# Patient Record
Sex: Male | Born: 1985
Health system: Southern US, Community
[De-identification: ages and names within clinical notes are randomized; demographics above are authoritative.]

## PROBLEM LIST (undated history)

## (undated) DIAGNOSIS — S81831A Puncture wound without foreign body, right lower leg, initial encounter: Secondary | ICD-10-CM

## (undated) DIAGNOSIS — G35 Multiple sclerosis: Secondary | ICD-10-CM

## (undated) DIAGNOSIS — K219 Gastro-esophageal reflux disease without esophagitis: Secondary | ICD-10-CM

## (undated) DIAGNOSIS — W3400XA Accidental discharge from unspecified firearms or gun, initial encounter: Secondary | ICD-10-CM

## (undated) HISTORY — PX: OTHER SURGICAL HISTORY: SHX169

## (undated) HISTORY — DX: Gastro-esophageal reflux disease without esophagitis: K21.9

## (undated) HISTORY — PX: APPENDECTOMY: SHX54

---
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
## 2003-01-12 ENCOUNTER — Emergency Department (HOSPITAL_COMMUNITY): Admission: EM | Admit: 2003-01-12 | Discharge: 2003-01-12 | Payer: Self-pay | Admitting: Emergency Medicine

## 2003-12-23 ENCOUNTER — Inpatient Hospital Stay (HOSPITAL_COMMUNITY): Admission: EM | Admit: 2003-12-23 | Discharge: 2003-12-24 | Payer: Self-pay | Admitting: Emergency Medicine

## 2003-12-24 ENCOUNTER — Encounter (INDEPENDENT_AMBULATORY_CARE_PROVIDER_SITE_OTHER): Payer: Self-pay | Admitting: *Deleted

## 2004-04-03 ENCOUNTER — Emergency Department (HOSPITAL_COMMUNITY): Admission: EM | Admit: 2004-04-03 | Discharge: 2004-04-03 | Payer: Self-pay | Admitting: Emergency Medicine

## 2004-06-08 ENCOUNTER — Emergency Department (HOSPITAL_COMMUNITY): Admission: EM | Admit: 2004-06-08 | Discharge: 2004-06-08 | Payer: Self-pay | Admitting: Family Medicine

## 2004-06-10 ENCOUNTER — Emergency Department (HOSPITAL_COMMUNITY): Admission: EM | Admit: 2004-06-10 | Discharge: 2004-06-10 | Payer: Self-pay | Admitting: *Deleted

## 2005-03-02 ENCOUNTER — Emergency Department (HOSPITAL_COMMUNITY): Admission: EM | Admit: 2005-03-02 | Discharge: 2005-03-02 | Payer: Self-pay | Admitting: Emergency Medicine

## 2005-12-26 ENCOUNTER — Emergency Department (HOSPITAL_COMMUNITY): Admission: EM | Admit: 2005-12-26 | Discharge: 2005-12-26 | Payer: Self-pay | Admitting: Emergency Medicine

## 2006-05-06 ENCOUNTER — Emergency Department (HOSPITAL_COMMUNITY): Admission: EM | Admit: 2006-05-06 | Discharge: 2006-05-06 | Payer: Self-pay | Admitting: Emergency Medicine

## 2006-05-19 ENCOUNTER — Emergency Department (HOSPITAL_COMMUNITY): Admission: EM | Admit: 2006-05-19 | Discharge: 2006-05-19 | Payer: Self-pay | Admitting: Family Medicine

## 2006-06-13 ENCOUNTER — Emergency Department (HOSPITAL_COMMUNITY): Admission: EM | Admit: 2006-06-13 | Discharge: 2006-06-13 | Payer: Self-pay | Admitting: Family Medicine

## 2008-10-28 ENCOUNTER — Emergency Department (HOSPITAL_COMMUNITY): Admission: EM | Admit: 2008-10-28 | Discharge: 2008-10-28 | Payer: Self-pay | Admitting: Emergency Medicine

## 2008-10-31 ENCOUNTER — Emergency Department (HOSPITAL_COMMUNITY): Admission: EM | Admit: 2008-10-31 | Discharge: 2008-10-31 | Payer: Self-pay | Admitting: Emergency Medicine

## 2008-11-03 ENCOUNTER — Emergency Department (HOSPITAL_COMMUNITY): Admission: EM | Admit: 2008-11-03 | Discharge: 2008-11-03 | Payer: Self-pay | Admitting: Emergency Medicine

## 2010-10-30 NOTE — Op Note (Signed)
NAME:  Shaun Bentley, Shaun Bentley                          ACCOUNT NO.:  [id number]   MEDICAL RECORD NO.:  [id number]                   PATIENT TYPE:  EMS   LOCATION:  ED                                   FACILITY:  [facility]   PHYSICIAN:  Anselm Pancoast. Zachery Dakins, M.D.          DATE OF BIRTH:  1985-07-23   DATE OF PROCEDURE:  12/23/2003  DATE OF DISCHARGE:                                 OPERATIVE REPORT   PREOPERATIVE DIAGNOSIS:  Possible early acute appendicitis.   POSTOPERATIVE DIAGNOSIS:  Await final pathology.   OPERATION:  Appendectomy.   SURGEON:  Anselm Pancoast. Zachery Dakins, M.D.   HISTORY:  Shaun Bentley is a 25 year old male who presented to the emergency  room this evening, was seen by Dr. Stacie Acres, who describes the patient having  right lower quadrant pain.  It appears that the patient morning started  having lower abdominal pain.  He has had a back problem, and he sort of  moved or wiggled or something or other and then was complaining of more  abdominal pain and when he was seen first in the emergency room, it was  described as very uncomfortable, and he was given lab studies.  His white  count was not elevated but then because of the pain, he had a CT and the CT  of the abdomen and pelvis is consistent with an early appendicitis.  I could  not see any obvious inflammation, but the appendix was about 9-10 mm and  when I saw him, he had received 4 mg of Dilaudid and was just basically  sleepy, but if you tried to wiggle on him, pushing the right lower quadrant,  he complained of pain.  His urinalysis was negative, and I did not see any  other abnormalities on the CT and thought it best just to proceed on with an  open appendectomy.  The patient is very thin, and I thought that an open  would be less traumatic than trying to do a laparoscopic.   The patient was given 3 g of Unasyn in the emergency room, permission  obtained, and he was taken to the operative suite and induction of general  anesthesia.  The abdomen was prepped with Betadine surgical solution after  clipping the right lower quadrant and then draped in a sterile manner.  Within the lateral edge of the rectus and kind of in the right lower  quadrant, a little oblique incision was made, sharp dissection then through  the skin, the Scarpa's fascia, and the external oblique was opened about an  inch.  The internal oblique and transversalis was stretched in the direction  of the fibers and then the peritoneum was picked up between hemostats and  carefully opened into the peritoneal cavity.  There was no exudate or any  vein in the right lower quadrant, and the appendix was visualized under the  cecum and grasped.  I could not see an obvious  acute appendicitis.  The  appendix is a little prominent in size but not engorged or basically stiff  like early acute appendicitis.  Then the appendiceal mesentery was divided  between hemostats, and these were ligated with 2-0 Vicryl.  The appendix was  freed right up to its base and then crushed with a hemostat and ligated with  2-0 Vicryl.  A pursestring suture of 3-0 silk was placed, the appendix  removed, the stump inverted, and the pursestring suture tied.  A second Z-  stitch was placed just to kind of reinforce the area.  The small bowel, the  terminal ileum looked normal and I ran the small bowel probably four or five  feet, did not see any evidence of a Meckel's, and there was no evidence of  any inflammation that I could see.  The small bowel was back in its normal  location and then I grasped the peritoneum with hemostats.  I just injected  the abdominal wall with 0.5% plain Marcaine for immediate postoperative pain  and then closed the peritoneum and internal oblique with a running 2-0  Vicryl.  The external oblique was closed with also a running 2-0 Vicryl,  Scarpa's  fascia closed with interrupted 4-0 Vicryl, and half-inch Steri-Strips and  Benzoin on the skin.   The patient tolerated the procedure nicely and was  taken to the recovery room in stable postop condition.  Await the results of  the final exam, and we will resume a liquid diet and progress as tolerated.                                               Anselm Pancoast. Zachery Dakins, M.D.    WJW/MEDQ  D:  12/24/2003  T:  12/24/2003  Job:  [id number]

## 2011-01-09 ENCOUNTER — Emergency Department (HOSPITAL_COMMUNITY)
Admission: EM | Admit: 2011-01-09 | Discharge: 2011-01-10 | Disposition: A | Discharge status: Home / Self Care | Payer: Self-pay | Attending: Emergency Medicine | Admitting: Emergency Medicine

## 2011-01-09 DIAGNOSIS — M25569 Pain in unspecified knee: Secondary | ICD-10-CM | POA: Insufficient documentation

## 2011-01-10 ENCOUNTER — Emergency Department (HOSPITAL_COMMUNITY)
Admission: EM | Admit: 2011-01-10 | Discharge: 2011-01-10 | Disposition: A | Discharge status: Home / Self Care | Payer: Self-pay | Attending: Emergency Medicine | Admitting: Emergency Medicine

## 2011-01-10 ENCOUNTER — Emergency Department (HOSPITAL_COMMUNITY): Payer: Self-pay

## 2011-01-10 DIAGNOSIS — M25569 Pain in unspecified knee: Secondary | ICD-10-CM | POA: Insufficient documentation

## 2011-07-06 ENCOUNTER — Encounter (HOSPITAL_COMMUNITY): Payer: Self-pay | Admitting: Emergency Medicine

## 2011-07-06 ENCOUNTER — Emergency Department (HOSPITAL_COMMUNITY)
Admission: EM | Admit: 2011-07-06 | Discharge: 2011-07-06 | Disposition: A | Discharge status: Home / Self Care | Payer: Self-pay | Attending: Emergency Medicine | Admitting: Emergency Medicine

## 2011-07-06 DIAGNOSIS — M6282 Rhabdomyolysis: Secondary | ICD-10-CM | POA: Insufficient documentation

## 2011-07-06 DIAGNOSIS — M79609 Pain in unspecified limb: Secondary | ICD-10-CM | POA: Insufficient documentation

## 2011-07-06 DIAGNOSIS — R252 Cramp and spasm: Secondary | ICD-10-CM | POA: Insufficient documentation

## 2011-07-06 DIAGNOSIS — Z87828 Personal history of other (healed) physical injury and trauma: Secondary | ICD-10-CM | POA: Insufficient documentation

## 2011-07-06 HISTORY — DX: Accidental discharge from unspecified firearms or gun, initial encounter: W34.00XA

## 2011-07-06 HISTORY — DX: Puncture wound without foreign body, right lower leg, initial encounter: S81.831A

## 2011-07-06 LAB — URINE MICROSCOPIC-ADD ON

## 2011-07-06 LAB — POCT I-STAT, CHEM 8
BUN: 26 mg/dL — ABNORMAL HIGH (ref 6–23)
Chloride: 105 mEq/L (ref 96–112)
Creatinine, Ser: 1.3 mg/dL (ref 0.50–1.35)
Potassium: 4 mEq/L (ref 3.5–5.1)
Sodium: 139 mEq/L (ref 135–145)

## 2011-07-06 LAB — URINALYSIS, ROUTINE W REFLEX MICROSCOPIC
Glucose, UA: NEGATIVE mg/dL
Specific Gravity, Urine: 1.014 (ref 1.005–1.030)

## 2011-07-06 MED ORDER — OXYCODONE-ACETAMINOPHEN 5-325 MG PO TABS
1.0000 | ORAL_TABLET | Freq: Once | ORAL | Status: AC
  Administered 2011-07-06: 1 via ORAL
  Filled 2011-07-06: qty 1

## 2011-07-06 MED ORDER — SODIUM CHLORIDE 0.9 % IV BOLUS (SEPSIS)
1000.0000 mL | Freq: Once | INTRAVENOUS | Status: AC
  Administered 2011-07-06: 1000 mL via INTRAVENOUS

## 2011-07-06 MED ORDER — CYCLOBENZAPRINE HCL 5 MG PO TABS: 5.0000 mg | ORAL_TABLET | Freq: Three times a day (TID) | ORAL | Status: AC | PRN

## 2011-07-06 MED ORDER — DIAZEPAM 5 MG PO TABS
10.0000 mg | ORAL_TABLET | Freq: Once | ORAL | Status: AC
  Administered 2011-07-06: 10 mg via ORAL
  Filled 2011-07-06: qty 2

## 2011-07-06 MED ORDER — OXYCODONE-ACETAMINOPHEN 5-325 MG PO TABS: 1.0000 | ORAL_TABLET | Freq: Four times a day (QID) | ORAL | Status: AC | PRN

## 2011-07-06 NOTE — ED Notes (Addendum)
C/o cramps in both lower extremities---also reports profuse sweating at night--saturating the sheets and mattress.  Works for [employer] and does a lot of heavy lifting during his 10 hour work day.

## 2011-07-06 NOTE — ED Notes (Signed)
Continues to sleep on stretcher and in no distress--Significant other at bedside---awaiting further orders.

## 2011-07-06 NOTE — ED Notes (Signed)
Sleeping on carrier--IV fluids infusing---awakens easily with verbal.

## 2011-07-06 NOTE — ED Notes (Signed)
Pt states he is having cramps in his legs and feet  Pt states it started earlier today and has progressively gotten worse   Pt states he has had decreased appetite lately

## 2011-07-06 NOTE — ED Notes (Signed)
Sleeping on carrier--Significant other at bedside.
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
## 2011-07-27 ENCOUNTER — Emergency Department (HOSPITAL_COMMUNITY)
Admission: EM | Admit: 2011-07-27 | Discharge: 2011-07-27 | Disposition: A | Discharge status: Home / Self Care | Payer: Self-pay | Attending: Emergency Medicine | Admitting: Emergency Medicine

## 2011-07-27 ENCOUNTER — Emergency Department (HOSPITAL_COMMUNITY): Payer: Self-pay

## 2011-07-27 DIAGNOSIS — R05 Cough: Secondary | ICD-10-CM | POA: Insufficient documentation

## 2011-07-27 DIAGNOSIS — R059 Cough, unspecified: Secondary | ICD-10-CM | POA: Insufficient documentation

## 2011-07-27 DIAGNOSIS — R509 Fever, unspecified: Secondary | ICD-10-CM | POA: Insufficient documentation

## 2011-07-27 DIAGNOSIS — R6889 Other general symptoms and signs: Secondary | ICD-10-CM | POA: Insufficient documentation

## 2011-07-27 DIAGNOSIS — R10816 Epigastric abdominal tenderness: Secondary | ICD-10-CM | POA: Insufficient documentation

## 2011-07-27 DIAGNOSIS — R52 Pain, unspecified: Secondary | ICD-10-CM | POA: Insufficient documentation

## 2011-07-27 DIAGNOSIS — R079 Chest pain, unspecified: Secondary | ICD-10-CM | POA: Insufficient documentation

## 2011-07-27 DIAGNOSIS — IMO0001 Reserved for inherently not codable concepts without codable children: Secondary | ICD-10-CM | POA: Insufficient documentation

## 2011-07-27 DIAGNOSIS — J111 Influenza due to unidentified influenza virus with other respiratory manifestations: Secondary | ICD-10-CM

## 2011-07-27 DIAGNOSIS — R070 Pain in throat: Secondary | ICD-10-CM | POA: Insufficient documentation

## 2011-07-27 LAB — POCT I-STAT, CHEM 8
Glucose, Bld: 98 mg/dL (ref 70–99)
HCT: 48 % (ref 39.0–52.0)
Hemoglobin: 16.3 g/dL (ref 13.0–17.0)
Potassium: 4 mEq/L (ref 3.5–5.1)
Sodium: 141 mEq/L (ref 135–145)

## 2011-07-27 MED ORDER — OSELTAMIVIR PHOSPHATE 75 MG PO CAPS: 75.0000 mg | ORAL_CAPSULE | Freq: Two times a day (BID) | ORAL | Status: AC

## 2011-07-27 MED ORDER — IBUPROFEN 600 MG PO TABS: 600.0000 mg | ORAL_TABLET | Freq: Four times a day (QID) | ORAL | Status: AC | PRN
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
## 2011-11-25 ENCOUNTER — Inpatient Hospital Stay (HOSPITAL_COMMUNITY)
Admission: EM | Admit: 2011-11-25 | Discharge: 2011-11-28 | DRG: 864 | Disposition: A | Discharge status: Home / Self Care | Payer: Self-pay | Attending: Family Medicine | Admitting: Family Medicine

## 2011-11-25 ENCOUNTER — Other Ambulatory Visit: Payer: Self-pay | Admitting: Oncology

## 2011-11-25 ENCOUNTER — Encounter (HOSPITAL_COMMUNITY): Payer: Self-pay | Admitting: *Deleted

## 2011-11-25 DIAGNOSIS — M311 Thrombotic microangiopathy, unspecified: Secondary | ICD-10-CM

## 2011-11-25 DIAGNOSIS — R1013 Epigastric pain: Secondary | ICD-10-CM | POA: Diagnosis present

## 2011-11-25 DIAGNOSIS — Z79899 Other long term (current) drug therapy: Secondary | ICD-10-CM

## 2011-11-25 DIAGNOSIS — E876 Hypokalemia: Secondary | ICD-10-CM | POA: Diagnosis present

## 2011-11-25 DIAGNOSIS — R110 Nausea: Secondary | ICD-10-CM | POA: Diagnosis present

## 2011-11-25 DIAGNOSIS — D693 Immune thrombocytopenic purpura: Secondary | ICD-10-CM

## 2011-11-25 DIAGNOSIS — B9789 Other viral agents as the cause of diseases classified elsewhere: Secondary | ICD-10-CM

## 2011-11-25 DIAGNOSIS — K5289 Other specified noninfective gastroenteritis and colitis: Secondary | ICD-10-CM | POA: Diagnosis present

## 2011-11-25 DIAGNOSIS — R5381 Other malaise: Secondary | ICD-10-CM | POA: Diagnosis present

## 2011-11-25 DIAGNOSIS — Z7982 Long term (current) use of aspirin: Secondary | ICD-10-CM

## 2011-11-25 DIAGNOSIS — R7309 Other abnormal glucose: Secondary | ICD-10-CM | POA: Diagnosis present

## 2011-11-25 DIAGNOSIS — D696 Thrombocytopenia, unspecified: Secondary | ICD-10-CM

## 2011-11-25 DIAGNOSIS — R509 Fever, unspecified: Principal | ICD-10-CM | POA: Diagnosis present

## 2011-11-25 DIAGNOSIS — G35 Multiple sclerosis: Secondary | ICD-10-CM | POA: Diagnosis present

## 2011-11-25 DIAGNOSIS — R5383 Other fatigue: Secondary | ICD-10-CM | POA: Diagnosis present

## 2011-11-25 HISTORY — DX: Multiple sclerosis: G35

## 2011-11-25 LAB — DIFFERENTIAL
Lymphs Abs: 1.1 10*3/uL (ref 0.7–4.0)
Monocytes Absolute: 0.8 10*3/uL (ref 0.1–1.0)
Monocytes Relative: 8 % (ref 3–12)
Neutro Abs: 7.9 10*3/uL — ABNORMAL HIGH (ref 1.7–7.7)
Neutrophils Relative %: 81 % — ABNORMAL HIGH (ref 43–77)

## 2011-11-25 LAB — COMPREHENSIVE METABOLIC PANEL
BUN: 15 mg/dL (ref 6–23)
Calcium: 9.5 mg/dL (ref 8.4–10.5)
GFR calc Af Amer: >90 mL/min (ref >90)
Glucose, Bld: 77 mg/dL (ref 70–99)
Total Protein: 7 g/dL (ref 6.0–8.3)

## 2011-11-25 LAB — OCCULT BLOOD, POC DEVICE: Fecal Occult Bld: NEGATIVE

## 2011-11-25 LAB — TYPE AND SCREEN: Antibody Screen: NEGATIVE

## 2011-11-25 LAB — LIPASE, BLOOD: Lipase: 28 U/L (ref 11–59)

## 2011-11-25 LAB — URINALYSIS, ROUTINE W REFLEX MICROSCOPIC
Bilirubin Urine: NEGATIVE
Nitrite: NEGATIVE
Specific Gravity, Urine: 1.035 — ABNORMAL HIGH (ref 1.005–1.030)
Urobilinogen, UA: 0.2 mg/dL (ref 0.0–1.0)

## 2011-11-25 LAB — CBC
HCT: 42.7 % (ref 39.0–52.0)
Hemoglobin: 15.1 g/dL (ref 13.0–17.0)
RBC: 4.81 MIL/uL (ref 4.22–5.81)

## 2011-11-25 LAB — LACTATE DEHYDROGENASE: LDH: 174 U/L (ref 94–250)

## 2011-11-25 LAB — URINE MICROSCOPIC-ADD ON

## 2011-11-25 MED ORDER — GI COCKTAIL ~~LOC~~
30.0000 mL | Freq: Once | ORAL | Status: AC
  Administered 2011-11-25: 30 mL via ORAL
  Filled 2011-11-25: qty 30

## 2011-11-25 MED ORDER — FAMOTIDINE IN NACL 20-0.9 MG/50ML-% IV SOLN
20.0000 mg | Freq: Once | INTRAVENOUS | Status: AC
  Administered 2011-11-25: 20 mg via INTRAVENOUS
  Filled 2011-11-25: qty 50

## 2011-11-25 MED ORDER — OMEPRAZOLE 20 MG PO CPDR: 20.0000 mg | DELAYED_RELEASE_CAPSULE | Freq: Every day | ORAL | Status: DC

## 2011-11-25 MED ORDER — SODIUM CHLORIDE 0.9 % IJ SOLN
3.0000 mL | Freq: Two times a day (BID) | INTRAMUSCULAR | Status: DC
  Administered 2011-11-25 – 2011-11-27 (×5): 3 mL via INTRAVENOUS

## 2011-11-25 MED ORDER — FAMOTIDINE 20 MG PO TABS: 20.0000 mg | ORAL_TABLET | Freq: Two times a day (BID) | ORAL | Status: DC

## 2011-11-25 MED ORDER — IMMUNE GLOBULIN (HUMAN) 10 GM/200ML IV SOLN
1.0000 g/kg | Freq: Once | INTRAVENOUS | Status: DC
  Filled 2011-11-25: qty 1600

## 2011-11-25 MED ORDER — PANTOPRAZOLE SODIUM 40 MG IV SOLR
40.0000 mg | Freq: Once | INTRAVENOUS | Status: AC
  Administered 2011-11-25: 40 mg via INTRAVENOUS
  Filled 2011-11-25: qty 40

## 2011-11-25 MED ORDER — ONDANSETRON 8 MG PO TBDP
8.0000 mg | ORAL_TABLET | Freq: Once | ORAL | Status: AC
  Administered 2011-11-25: 8 mg via ORAL
  Filled 2011-11-25: qty 1

## 2011-11-25 MED ORDER — DOXYCYCLINE HYCLATE 100 MG PO TABS
100.0000 mg | ORAL_TABLET | Freq: Two times a day (BID) | ORAL | Status: DC
  Administered 2011-11-25 – 2011-11-26 (×2): 100 mg via ORAL
  Filled 2011-11-25 (×3): qty 1

## 2011-11-25 MED ORDER — ONDANSETRON HCL 4 MG/2ML IJ SOLN: 4.0000 mg | Freq: Three times a day (TID) | INTRAMUSCULAR | Status: AC | PRN

## 2011-11-25 MED ORDER — SODIUM CHLORIDE 0.9 % IV SOLN: INTRAVENOUS | Status: AC

## 2011-11-25 MED ORDER — OXYCODONE-ACETAMINOPHEN 5-325 MG PO TABS
2.0000 | ORAL_TABLET | Freq: Once | ORAL | Status: AC
  Administered 2011-11-25: 2 via ORAL
  Filled 2011-11-25: qty 2

## 2011-11-25 MED ORDER — POTASSIUM CHLORIDE CRYS ER 20 MEQ PO TBCR
20.0000 meq | EXTENDED_RELEASE_TABLET | Freq: Once | ORAL | Status: AC
  Administered 2011-11-25: 20 meq via ORAL
  Filled 2011-11-25: qty 1

## 2011-11-25 MED ORDER — OXYCODONE-ACETAMINOPHEN 5-325 MG PO TABS
1.0000 | ORAL_TABLET | ORAL | Status: DC | PRN
  Administered 2011-11-25 – 2011-11-26 (×6): 1 via ORAL
  Filled 2011-11-25 (×6): qty 1

## 2011-11-25 MED ORDER — FAMOTIDINE IN NACL 20-0.9 MG/50ML-% IV SOLN: 20.0000 mg | Freq: Once | INTRAVENOUS | Status: DC

## 2011-11-25 MED ORDER — PREDNISONE 20 MG PO TABS
80.0000 mg | ORAL_TABLET | Freq: Once | ORAL | Status: AC
  Administered 2011-11-25: 80 mg via ORAL
  Filled 2011-11-25: qty 4
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
## 2011-11-25 NOTE — ED Notes (Signed)
5 east called for report 1x. RN will call back
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
## 2011-11-25 NOTE — ED Notes (Signed)
Pt. Given urine cup

## 2011-11-25 NOTE — ED Provider Notes (Signed)
Medical screening examination/treatment/procedure(s) were conducted as a shared visit with non-physician practitioner(s) and myself.  I personally evaluated the patient during the encounter  Epigastric abdominal pain which is been colicky. He has nausea but no vomiting. Intermittent episodes of diarrhea without blood. Rectal examination is negative. Found to be profoundly from cytopenic with a platelet count less than 5. Will be admitted to the hospital. Additional fluid studies were obtained.  Steroids administered  Dayton Bailiff, MD 11/25/11 1620

## 2011-11-25 NOTE — ED Notes (Signed)
Girlfriend states "he's been sick since yesterday, has severe abdominal pain, diarrhea, it even hurts to touch his skin, has just taken pepto bismol"

## 2011-11-25 NOTE — ED Notes (Signed)
Attempted to obtain labs.patient is on phone requesting to obtain labs later.RN Silvio Pate made aware

## 2011-11-25 NOTE — ED Notes (Signed)
Pt. Is on phone. Requesting for the phlebotomy to come at a later time.

## 2011-11-26 DIAGNOSIS — B9789 Other viral agents as the cause of diseases classified elsewhere: Secondary | ICD-10-CM

## 2011-11-26 DIAGNOSIS — M311 Thrombotic microangiopathy, unspecified: Secondary | ICD-10-CM

## 2011-11-26 LAB — COMPREHENSIVE METABOLIC PANEL
ALT: 15 U/L (ref 0–53)
Alkaline Phosphatase: 61 U/L (ref 39–117)
CO2: 25 mEq/L (ref 19–32)
Chloride: 101 mEq/L (ref 96–112)
GFR calc Af Amer: 84 mL/min — ABNORMAL LOW (ref >90)
GFR calc non Af Amer: 73 mL/min — ABNORMAL LOW (ref >90)
Glucose, Bld: 136 mg/dL — ABNORMAL HIGH (ref 70–99)
Potassium: 4.5 mEq/L (ref 3.5–5.1)
Sodium: 135 mEq/L (ref 135–145)
Total Bilirubin: 0.5 mg/dL (ref 0.3–1.2)

## 2011-11-26 LAB — DIFFERENTIAL
Basophils Relative: 0 % (ref 0–1)
Eosinophils Absolute: 0 10*3/uL (ref 0.0–0.7)
Neutrophils Relative %: 94 % — ABNORMAL HIGH (ref 43–77)

## 2011-11-26 LAB — CBC
MCH: 31.5 pg (ref 26.0–34.0)
MCHC: 35.7 g/dL (ref 30.0–36.0)
Platelets: 193 10*3/uL (ref 150–400)

## 2011-11-26 LAB — EHRLICHIA ANTIBODY PANEL
E chaffeensis (HGE) Ab, IgG: NEGATIVE
E chaffeensis (HGE) Ab, IgM: NEGATIVE

## 2011-11-26 LAB — LIPASE, BLOOD: Lipase: 22 U/L (ref 11–59)

## 2011-11-26 MED ORDER — FAMOTIDINE 40 MG PO TABS
40.0000 mg | ORAL_TABLET | Freq: Two times a day (BID) | ORAL | Status: DC
  Administered 2011-11-26 – 2011-11-28 (×4): 40 mg via ORAL
  Filled 2011-11-26 (×5): qty 1

## 2011-11-26 MED ORDER — LORAZEPAM 0.5 MG PO TABS
0.5000 mg | ORAL_TABLET | Freq: Two times a day (BID) | ORAL | Status: DC
  Administered 2011-11-26 – 2011-11-27 (×3): 0.5 mg via ORAL
  Filled 2011-11-26 (×4): qty 1

## 2011-11-26 MED ORDER — HYDROMORPHONE HCL PF 1 MG/ML IJ SOLN
1.0000 mg | INTRAMUSCULAR | Status: DC | PRN
  Administered 2011-11-26 – 2011-11-27 (×5): 1 mg via INTRAVENOUS
  Filled 2011-11-26 (×5): qty 1

## 2011-11-26 NOTE — Progress Notes (Signed)
PROGRESS NOTE  KYZER BLOWE ZOX:[id number] DOB: 12-16-1985 DOA: 11/25/2011 PCP: No primary provider on file.  Brief narrative: 26 yr old admitted with severe TCP (later found to be lab error), N/Diarrhoea  Past medical history: Appendectomy and surgeries  Consultants:  none  Procedures:  Ehlichia/RMSF/HIV neg  Antibiotics:  Doxycycline 100 bid 6/13-->6/14   Subjective  Has abdominal pain, central abdomen.  No assosc n/v  Stool was dark today, but no noted blood.   Denies NSAID abuse-states Abdmonal pain is worse when he eats.  No classical pancreatic signs  like boring pain to the back No rash, cough,fever    Objective    Interim History: Nursing reports increased pain needs-requesting Opiates on the 4 hour mark  Subjective:   Objective: Filed Vitals:   11/25/11 2000 11/25/11 2225 11/26/11 0510 11/26/11 1450  BP:  102/66 129/79 122/59  Pulse:  61 59 74  Temp:  98.3 F (36.8 C) 97.8 F (36.6 C) 98.1 F (36.7 C)  TempSrc:  Oral Oral Oral  Resp:  18 18 18   Height: 5\' 11"  (1.803 m)     Weight: 78.926 kg (174 lb)     SpO2:  98% 99% 100%   No intake or output data in the 24 hours ending 11/26/11 1827  Exam:   General:  Alert pleasant African American male mentating well. No apparent distress.   Eyes: No scleral icterus, no pallor   ENT: Neck soft supple no palpable lymphadenopathy in cervical or submandibular lymph nodes-throat is clear without rash and has good dentition   Neck: See above   Cardiovascular: S1-S2 no murmur rub or gallop   Respiratory: Clinically clear   Abdomen: Soft nontender nondistended no palpable hepatosplenomegaly-no rebound or gaurding.  No epig tenderness  Skin: No rash- multiple tattoos   Musculoskeletal: No joint dysfunction or limited range of motion to all 4 extremities   Psychiatric: Euthymic   Neurologic: Grossly normal  Data Reviewed: Basic Metabolic Panel:  Lab 11/26/11 8119 11/25/11 1300  NA 135 137  K  4.5 3.4*  CL 101 101  CO2 25 24  GLUCOSE 136* 77  BUN 16 15  CREATININE 1.31 1.17  CALCIUM 9.6 9.5  MG -- --  PHOS -- --   Liver Function Tests:  Lab 11/26/11 0514 11/25/11 1300  AST 15 16  ALT 15 15  ALKPHOS 61 64  BILITOT 0.5 0.7  PROT 7.1 7.0  ALBUMIN 3.9 4.2    Lab 11/25/11 1300  LIPASE 28  AMYLASE --   No results found for this basename: AMMONIA:5 in the last 168 hours CBC:  Lab 11/26/11 0500 11/25/11 1741  WBC 13.8* 9.8  NEUTROABS 12.9* 7.9*  HGB 15.4 15.1  HCT 43.1 42.7  MCV 88.1 88.8  PLT 193 185   Cardiac Enzymes: No results found for this basename: CKTOTAL:5,CKMB:5,CKMBINDEX:5,TROPONINI:5 in the last 168 hours BNP: No components found with this basename: POCBNP:5 CBG: No results found for this basename: GLUCAP:5 in the last 168 hours  No results found for this or any previous visit (from the past 240 hour(s)).   Studies:              All Imaging reviewed and is as per above notation   Scheduled Meds:   . sodium chloride   Intravenous STAT  . sodium chloride  3 mL Intravenous Q12H  . DISCONTD: doxycycline  100 mg Oral Q12H  . DISCONTD: Immune Globulin 5%  1 g/kg Intravenous Once   Continuous Infusions:  Assessment/Plan: 1. Febrile illness-given vague abd pain, malaise with "skin hurting"differentials include Viral GE vs occult bacterial illness (possible Gastric pathogen)  However diarrhea seems to have completely stopped.  Will order stool studies, check Hepatitis panel (although unlikely given normal LFT's) and for now d/c doxycycline and keep him on clear liquids since he isn't able to take much PO anyway.  I will add some Pepcid to medication.  If he spikes a temperature will get Blood and Urine culture stat as well as CXR and will order broad spectrum coverage with Vancomycin and Zosyn-rule out Pancreatitis (unlikely) with Lipase 2. Abd pain-Possibly related to Ulcer?  Add PPI-  If hemoccult +, would consult GI for recs, as he stated he had  "dark stool"  Code Status: Full  Family Communication: none at bedside  Disposition Plan: inpatient   Pleas Koch, MD  [facility] Pager [phone] 11/26/2011, 6:27 PM    LOS: 1 day

## 2011-11-26 NOTE — Progress Notes (Signed)
Late entry for 11/25/11 1215 Pt listed as self pay with no insurance coverage Pt confirms he is self pay [place] resident CM and [facility] community liaison spoke with him Pt offered [facility] services to assist with finding a [place] self pay provider Pt accepted information

## 2011-11-26 NOTE — Progress Notes (Signed)
Per extern patient was arguing with some visitors.  Checked on patient noted to be crying and upset.  Asked for something for anxiety.  Paged MD and order given for ativan 0.5mg  PO BID

## 2011-11-26 NOTE — Progress Notes (Signed)
CARE MANAGEMENT NOTE 11/26/2011  Patient:  Rabbani,Plez A   Account Number:  [id number]  Date Initiated:  11/26/2011  Documentation initiated by:  Lissie Hinesley  Subjective/Objective Assessment:   pt with platlets count of less than 5.  c/o weakness nausea and vomiting     Action/Plan:   lives at home   Anticipated DC Date:  11/29/2011   Anticipated DC Plan:  HOME/SELF CARE  In-house referral  NA      DC Planning Services  NA      [facility] Choice  NA   Choice offered to / List presented to:  NA   DME arranged  NA      DME agency  NA     HH arranged  NA      HH agency  NA   Status of service:  In process, will continue to follow Medicare Important Message given?  NA - LOS <3 / Initial given by admissions (If response is "NO", the following Medicare IM given date fields will be blank) Date Medicare IM given:   Date Additional Medicare IM given:    Discharge Disposition:    Per UR Regulation:  Reviewed for med. necessity/level of care/duration of stay  If discussed at Long Length of Stay Meetings, dates discussed:    Comments:  [id number]/Bahja Bence,RN,BSN,CCM

## 2011-11-27 DIAGNOSIS — B9789 Other viral agents as the cause of diseases classified elsewhere: Secondary | ICD-10-CM

## 2011-11-27 DIAGNOSIS — M311 Thrombotic microangiopathy, unspecified: Secondary | ICD-10-CM

## 2011-11-27 LAB — CBC
MCV: 87.2 fL (ref 78.0–100.0)
Platelets: 207 10*3/uL (ref 150–400)
RDW: 11.8 % (ref 11.5–15.5)
WBC: 8.1 10*3/uL (ref 4.0–10.5)

## 2011-11-27 LAB — BASIC METABOLIC PANEL
Chloride: 101 mEq/L (ref 96–112)
Creatinine, Ser: 1.08 mg/dL (ref 0.50–1.35)
GFR calc Af Amer: >90 mL/min (ref >90)
GFR calc non Af Amer: >90 mL/min (ref >90)
Potassium: 3.3 mEq/L — ABNORMAL LOW (ref 3.5–5.1)

## 2011-11-27 MED ORDER — TRAMADOL HCL 50 MG PO TABS
50.0000 mg | ORAL_TABLET | Freq: Four times a day (QID) | ORAL | Status: DC
  Administered 2011-11-27 – 2011-11-28 (×4): 50 mg via ORAL
  Filled 2011-11-27 (×8): qty 1

## 2011-11-27 MED ORDER — ACETAMINOPHEN 325 MG PO TABS: 650.0000 mg | ORAL_TABLET | Freq: Four times a day (QID) | ORAL | Status: DC | PRN

## 2011-11-27 NOTE — Progress Notes (Signed)
PROGRESS NOTE  DIONTAE ROUTE QMV:[id number] DOB: [birth year] DOA: 11/25/2011 PCP: No primary provider on file.  Brief narrative: 26 yr old admitted with severe TCP (later found to be lab error), N/Diarrhoea, malaise  Past medical history: Appendectomy and surgeries  Consultants:  none  Procedures:  Ehlichia/RMSF/HIV neg  Antibiotics:  Doxycycline 100 bid 6/13-->6/14   Subjective  Patient somewhat sedated. Taking by mouth fluids only. Wishes to eat.  No further abdominal pain, but has been getting that q3 No rash, cough,fever    Objective    Interim History: Nursing reports increased somnolence Subjective:   Objective: Filed Vitals:   11/26/11 0510 11/26/11 1450 11/26/11 2226 11/27/11 0610  BP: 129/79 122/59 112/76 109/59  Pulse: 59 74 56 70  Temp: 97.8 F (36.6 C) 98.1 F (36.7 C) 97.9 F (36.6 C) 98.1 F (36.7 C)  TempSrc: Oral Oral Oral Oral  Resp: 18 18 19 20   Height:      Weight:      SpO2: 99% 100% 98% 97%    Intake/Output Summary (Last 24 hours) at 11/27/11 1437 Last data filed at 11/26/11 2239  Gross per 24 hour  Intake      3 ml  Output      0 ml  Net      3 ml    Exam:   General:  Alert pleasant African American male mentating well. No apparent distress.   Eyes: No scleral icterus, no pallor   ENT: Neck soft supple no palpable lymphadenopathy in cervical or submandibular lymph nodes-throat is clear without rash and has good dentition   Neck: See above   Cardiovascular: S1-S2 no murmur rub or gallop   Respiratory: Clinically clear   Abdomen: Soft nontender nondistended no palpable hepatosplenomegaly-no rebound or gaurding.  No epig tenderness  Skin: No rash- multiple tattoos   Musculoskeletal: No joint dysfunction or limited range of motion to all 4 extremities   Psychiatric: Euthymic   Neurologic: Grossly normal  Data Reviewed: Basic Metabolic Panel:  Lab 11/27/11 2841 11/26/11 0514 11/25/11 1300  NA 136 135 137  K 3.3*  4.5 --  CL 101 101 101  CO2 25 25 24   GLUCOSE 111* 136* 77  BUN 18 16 15   CREATININE 1.08 1.31 1.17  CALCIUM 9.2 9.6 9.5  MG -- -- --  PHOS -- -- --   Liver Function Tests:  Lab 11/26/11 0514 11/25/11 1300  AST 15 16  ALT 15 15  ALKPHOS 61 64  BILITOT 0.5 0.7  PROT 7.1 7.0  ALBUMIN 3.9 4.2    Lab 11/26/11 0514 11/25/11 1300  LIPASE 22 28  AMYLASE -- --   No results found for this basename: AMMONIA:5 in the last 168 hours CBC:  Lab 11/27/11 0538 11/26/11 0500 11/25/11 1741  WBC 8.1 13.8* 9.8  NEUTROABS -- 12.9* 7.9*  HGB 15.2 15.4 15.1  HCT 42.3 43.1 42.7  MCV 87.2 88.1 88.8  PLT 207 193 185   Cardiac Enzymes: No results found for this basename: CKTOTAL:5,CKMB:5,CKMBINDEX:5,TROPONINI:5 in the last 168 hours BNP: No components found with this basename: POCBNP:5 CBG: No results found for this basename: GLUCAP:5 in the last 168 hours  No results found for this or any previous visit (from the past 240 hour(s)).   Studies:              All Imaging reviewed and is as per above notation   Scheduled Meds:    . sodium chloride   Intravenous STAT  .  famotidine  40 mg Oral BID  . LORazepam  0.5 mg Oral BID  . sodium chloride  3 mL Intravenous Q12H  . DISCONTD: doxycycline  100 mg Oral Q12H   Continuous Infusions:    Assessment/Plan: 1. Febrile illness-given vague abd pain, malaise with "skin hurting"differentials include Viral GE vs occult bacterial illness (possible Gastric pathogen)  However diarrhea seems to have completely stopped.  Will order stool studies, check Hepatitis panel -still pending (although unlikely given normal LFT's) and for now d/c doxycycline.  Lipase 22, ruling out pancreatitis-white count is down from 13-8 I will add some Pepcid to medication. 2.   Abd pain-Possibly related to Ulcer?  Add PPI-  patient has passed no stool, and this is likely secondary opiates.  Will DC the same 3. Elevated blood sugar without a diagnosis of diabetes-get A1c  in the morning 4. Hypokalemia-will replace if drops below 3.0.  BMET in the morning  Code Status: Full  Family Communication: none at bedside  Disposition Plan: inpatient-could potentially discharge in the morning   Pleas Koch, MD  [facility] Pager [phone] 11/27/2011, 2:37 PM    LOS: 2 days

## 2011-11-28 DIAGNOSIS — B9789 Other viral agents as the cause of diseases classified elsewhere: Secondary | ICD-10-CM

## 2011-11-28 DIAGNOSIS — M311 Thrombotic microangiopathy, unspecified: Secondary | ICD-10-CM

## 2011-11-28 LAB — CBC
HCT: 42.4 % (ref 39.0–52.0)
MCH: 31.2 pg (ref 26.0–34.0)
MCV: 87.6 fL (ref 78.0–100.0)
Platelets: 220 10*3/uL (ref 150–400)
RBC: 4.84 MIL/uL (ref 4.22–5.81)

## 2011-11-28 LAB — BASIC METABOLIC PANEL
BUN: 16 mg/dL (ref 6–23)
CO2: 30 mEq/L (ref 19–32)
Calcium: 9.6 mg/dL (ref 8.4–10.5)
Creatinine, Ser: 1.26 mg/dL (ref 0.50–1.35)
Glucose, Bld: 97 mg/dL (ref 70–99)

## 2011-11-28 MED ORDER — TRAMADOL HCL 50 MG PO TABS: 50.0000 mg | ORAL_TABLET | Freq: Four times a day (QID) | ORAL | Status: AC

## 2011-11-28 MED ORDER — PANTOPRAZOLE SODIUM 40 MG PO TBEC
40.0000 mg | DELAYED_RELEASE_TABLET | Freq: Two times a day (BID) | ORAL | Status: DC
Start: 2011-11-28 — End: 2011-11-28

## 2011-11-28 MED ORDER — OMEPRAZOLE 40 MG PO CPDR: 40.0000 mg | DELAYED_RELEASE_CAPSULE | Freq: Every day | ORAL | Status: DC

## 2011-11-28 MED ORDER — ACETAMINOPHEN 325 MG PO TABS: 650.0000 mg | ORAL_TABLET | Freq: Four times a day (QID) | ORAL | Status: AC | PRN

## 2011-11-28 MED ORDER — FAMOTIDINE 40 MG PO TABS: 40.0000 mg | ORAL_TABLET | Freq: Two times a day (BID) | ORAL | Status: DC

## 2011-11-28 NOTE — Care Management (Signed)
Cm spoke with pt concerning PCP referral. Pt provided with information concerning [facility] referral service & [facility]. Pt instructed to contact both agencies Monday 11/29/11 concerning scheduling a PCP follow up appt. No other needs stated.   Leonie Green [phone]

## 2011-11-28 NOTE — Progress Notes (Signed)
Patient tolerated advancement of diet to regular yesterday evening. He was requesting iv pain medication although he was asleep during rounds and when it was time for scheduled ultram. Plan of care and progression was discussed with the patient in reference to pain management. Pt admitted in front of day and night nurse that he enjoyed "the high" from iv dilaudid which has been discontinued

## 2011-11-28 NOTE — Discharge Summary (Signed)
[facility] Hospital Discharge Summary  Things to Follow-up on: Needs review by a PCP-might need coordination for PUD A1c was 5.4-needs coordiation with CM for PCP Follow Hepatitis panel as out-patient       Date of Admission: 11/25/2011 11:44 AM Admitter: @ADMITPROV @   Date of Discharge6/16/2013 Attending Physician: Rhetta Mura, MD  Shaun Bentley ZOX:[id number] DOB: 22-Oct-1985 DOA: 11/25/2011  PCP: No primary provider on file.   Brief narrative:  26 yr old admitted with severe TCP (later found to be lab error), N/Diarrhoea, malaise   Past medical history:  Appendectomy and surgeries   Consultants:  None  Procedures:  Ehlichia/RMSF/HIV neg  Antibiotics:  Doxycycline 100 bid 6/13-->6/14   Hospital Course by problem list: 1. Febrile illness-given vague abd pain, malaise with "skin hurting"differentials include Viral GE vs occult bacterial illness (possible Gastric pathogen)  However diarrhea seems to have completely stopped.  Will order stool studies (passed no stool), check Hepatitis panel -still pending (although unlikely given normal LFT's) and for now d/c doxycycline.  Lipase 22, ruling out pancreatitis-white count is down from 13--8-->5 I will add some Pepcid to medication + Omeprazole.  He should follow as an out-patient for further care 2.  Abd pain-Possibly related to Ulcer?  Add PPI- to the H2 blocker on d/c patient has passed no stool, and this is likely secondary opiates.  Will DC the same  3. Elevated blood sugar without a diagnosis of diabetes-A1c was 5.4.  Have explained needs diabetic counseling and to manage diet appropriately 4. Hypokalemia-replaced and stable   LOS: 3 days    Procedures Performed and pertinent labs: No results found.   Discharge Vitals & PE:  BP 99/63  Pulse 54  Temp 98 F (36.7 C) (Oral)  Resp 18  Ht 5\' 11"  (1.803 m)  Wt 78.926 kg (174 lb)  BMI 24.27 kg/m2  SpO2 96%General:  Alert pleasant African American male  mentating well. No apparent distress.   Eyes: No scleral icterus, no pallor   ENT: Neck soft supple no palpable lymphadenopathy in cervical or submandibular lymph nodes-throat is clear without rash and has good dentition   Neck: See above   Cardiovascular: S1-S2 no murmur rub or gallop   Respiratory: Clinically clear   Abdomen: Soft nontender nondistended no palpable hepatosplenomegaly-no rebound or gaurding.  No epig tenderness   Skin: No rash- multiple tattoos   Musculoskeletal: No joint dysfunction or limited range of motion to all 4 extremities   Psychiatric: Euthymic   Neurologic: Grossly normal    Discharge Labs:  Results for orders placed during the hospital encounter of 11/25/11 (from the past 24 hour(s))  CBC     Status: Normal   Collection Time   11/28/11  5:45 AM      Component Value Range   WBC 5.7  4.0 - 10.5 K/uL   RBC 4.84  4.22 - 5.81 MIL/uL   Hemoglobin 15.1  13.0 - 17.0 g/dL   HCT 81.1  91.4 - 78.2 %   MCV 87.6  78.0 - 100.0 fL   MCH 31.2  26.0 - 34.0 pg   MCHC 35.6  30.0 - 36.0 g/dL   RDW 95.6  21.3 - 08.6 %   Platelets 220  150 - 400 K/uL  BASIC METABOLIC PANEL     Status: Abnormal   Collection Time   11/28/11  5:45 AM      Component Value Range   Sodium 139  135 - 145 mEq/L   Potassium 3.5  3.5 - 5.1 mEq/L   Chloride 101  96 - 112 mEq/L   CO2 30  19 - 32 mEq/L   Glucose, Bld 97  70 - 99 mg/dL   BUN 16  6 - 23 mg/dL   Creatinine, Ser 7.82  0.50 - 1.35 mg/dL   Calcium 9.6  8.4 - 95.6 mg/dL   GFR calc non Af Amer 76 (*) >90 mL/min   GFR calc Af Amer 89 (*) >90 mL/min    Disposition and follow-up:   [name] was discharged from in good condition.    Follow-up Appointments: Will need to establish with PCP   Discharge Medications: Medication List  As of 11/28/2011  9:47 AM   STOP taking these medications         aspirin 325 MG tablet      OVER THE COUNTER MEDICATION         TAKE these medications         acetaminophen 325  MG tablet   Commonly known as: TYLENOL   Take 2 tablets (650 mg total) by mouth every 6 (six) hours as needed.      famotidine 40 MG tablet   Commonly known as: PEPCID   Take 1 tablet (40 mg total) by mouth 2 (two) times daily.      omeprazole 40 MG capsule   Commonly known as: PRILOSEC   Take 1 capsule (40 mg total) by mouth daily.      traMADol 50 MG tablet   Commonly known as: ULTRAM   Take 1 tablet (50 mg total) by mouth every 6 (six) hours.           Medications Discontinued During This Encounter  Medication Reason  . omeprazole (PRILOSEC) 20 MG capsule   . famotidine (PEPCID) 20 MG tablet   . famotidine (PEPCID) 20-0.9 MG/50ML-%   . Immune Globulin 5% (OCTAGAM) SOLN 80 g   . doxycycline (VIBRA-TABS) tablet 100 mg   . oxyCODONE-acetaminophen (PERCOCET) 5-325 MG per tablet 1 tablet   . HYDROmorphone (DILAUDID) injection 1 mg   . OVER THE COUNTER MEDICATION Stop Taking at Discharge  . aspirin 325 MG tablet Stop Taking at Discharge  . LORazepam (ATIVAN) tablet 0.5 mg   . sodium chloride 0.9 % injection 3 mL     > 40 minutes time spent preparing d/c summary, including direct face-face patient Time, contact with consultants, family and care coordination   Signed: Rhetta Mura 11/28/2011, 9:47 AM

## 2011-11-28 NOTE — Progress Notes (Signed)
Discharge instructions reviewed with patient no questions at this time  

## 2011-11-29 LAB — HEPATITIS PANEL, ACUTE
Hep B C IgM: NEGATIVE
Hepatitis B Surface Ag: NEGATIVE

## 2013-04-19 ENCOUNTER — Encounter (HOSPITAL_COMMUNITY): Payer: Self-pay | Admitting: Emergency Medicine

## 2013-04-19 ENCOUNTER — Emergency Department (HOSPITAL_COMMUNITY)
Admission: EM | Admit: 2013-04-19 | Discharge: 2013-04-19 | Disposition: A | Discharge status: Home / Self Care | Payer: Self-pay | Attending: Emergency Medicine | Admitting: Emergency Medicine

## 2013-04-19 DIAGNOSIS — M79609 Pain in unspecified limb: Secondary | ICD-10-CM | POA: Insufficient documentation

## 2013-04-19 DIAGNOSIS — Z87828 Personal history of other (healed) physical injury and trauma: Secondary | ICD-10-CM | POA: Insufficient documentation

## 2013-04-19 DIAGNOSIS — R519 Headache, unspecified: Secondary | ICD-10-CM | POA: Diagnosis present

## 2013-04-19 DIAGNOSIS — R252 Cramp and spasm: Secondary | ICD-10-CM | POA: Diagnosis present

## 2013-04-19 DIAGNOSIS — R000 Tachycardia, unspecified: Secondary | ICD-10-CM | POA: Insufficient documentation

## 2013-04-19 DIAGNOSIS — G8929 Other chronic pain: Secondary | ICD-10-CM | POA: Insufficient documentation

## 2013-04-19 DIAGNOSIS — Z8669 Personal history of other diseases of the nervous system and sense organs: Secondary | ICD-10-CM | POA: Insufficient documentation

## 2013-04-19 DIAGNOSIS — R51 Headache: Secondary | ICD-10-CM | POA: Insufficient documentation

## 2013-04-19 DIAGNOSIS — R209 Unspecified disturbances of skin sensation: Secondary | ICD-10-CM | POA: Insufficient documentation

## 2013-04-19 LAB — BASIC METABOLIC PANEL
BUN: 22 mg/dL (ref 6–23)
CO2: 26 mEq/L (ref 19–32)
Chloride: 103 mEq/L (ref 96–112)
Creatinine, Ser: 1.24 mg/dL (ref 0.50–1.35)
Glucose, Bld: 115 mg/dL — ABNORMAL HIGH (ref 70–99)

## 2013-04-19 LAB — CBC
HCT: 42.3 % (ref 39.0–52.0)
Hemoglobin: 15.2 g/dL (ref 13.0–17.0)
MCH: 32 pg (ref 26.0–34.0)
MCHC: 35.9 g/dL (ref 30.0–36.0)
MCV: 89.1 fL (ref 78.0–100.0)
RDW: 12.3 % (ref 11.5–15.5)

## 2013-04-19 MED ORDER — SODIUM CHLORIDE 0.9 % IV BOLUS (SEPSIS)
1000.0000 mL | INTRAVENOUS | Status: AC
  Administered 2013-04-19: 1000 mL via INTRAVENOUS

## 2013-04-19 MED ORDER — KETOROLAC TROMETHAMINE 30 MG/ML IJ SOLN
30.0000 mg | Freq: Once | INTRAMUSCULAR | Status: AC
  Administered 2013-04-19: 30 mg via INTRAVENOUS
  Filled 2013-04-19: qty 1

## 2013-04-19 MED ORDER — HYDROCOD POLST-CHLORPHEN POLST 10-8 MG/5ML PO LQCR: 5.0000 mL | Freq: Two times a day (BID) | ORAL | Status: DC | PRN

## 2013-04-19 MED ORDER — OXYCODONE-ACETAMINOPHEN 5-325 MG PO TABS: 1.0000 | ORAL_TABLET | Freq: Four times a day (QID) | ORAL | Status: DC | PRN

## 2013-04-19 NOTE — Progress Notes (Signed)
[facility] provided pt with a list of primary care resources.  °
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
## 2013-04-19 NOTE — ED Notes (Signed)
Pt states that two years ago went to doctor was told has MS pt symptoms werent that bad so he quit going. Pt states that he has bilat hand pain, bilat leg pain, headache, eye pain and other generalized body aches that have been going on but he has been able to deal with on his on. Pt states that last night his couldn't even turn a door knob.

## 2013-10-05 ENCOUNTER — Encounter (HOSPITAL_COMMUNITY): Payer: Self-pay | Admitting: Emergency Medicine

## 2013-10-05 ENCOUNTER — Emergency Department (HOSPITAL_COMMUNITY)
Admission: EM | Admit: 2013-10-05 | Discharge: 2013-10-05 | Disposition: A | Discharge status: Home / Self Care | Payer: No Typology Code available for payment source | Attending: Emergency Medicine | Admitting: Emergency Medicine

## 2013-10-05 ENCOUNTER — Emergency Department (HOSPITAL_COMMUNITY): Payer: No Typology Code available for payment source

## 2013-10-05 DIAGNOSIS — Y92410 Unspecified street and highway as the place of occurrence of the external cause: Secondary | ICD-10-CM | POA: Insufficient documentation

## 2013-10-05 DIAGNOSIS — S335XXA Sprain of ligaments of lumbar spine, initial encounter: Secondary | ICD-10-CM | POA: Insufficient documentation

## 2013-10-05 DIAGNOSIS — Y9389 Activity, other specified: Secondary | ICD-10-CM | POA: Insufficient documentation

## 2013-10-05 DIAGNOSIS — S39012A Strain of muscle, fascia and tendon of lower back, initial encounter: Secondary | ICD-10-CM

## 2013-10-05 DIAGNOSIS — Z79899 Other long term (current) drug therapy: Secondary | ICD-10-CM | POA: Insufficient documentation

## 2013-10-05 DIAGNOSIS — Z8669 Personal history of other diseases of the nervous system and sense organs: Secondary | ICD-10-CM | POA: Insufficient documentation

## 2013-10-05 DIAGNOSIS — S161XXA Strain of muscle, fascia and tendon at neck level, initial encounter: Secondary | ICD-10-CM

## 2013-10-05 DIAGNOSIS — S139XXA Sprain of joints and ligaments of unspecified parts of neck, initial encounter: Secondary | ICD-10-CM | POA: Insufficient documentation

## 2013-10-05 MED ORDER — OXYCODONE-ACETAMINOPHEN 5-325 MG PO TABS
1.0000 | ORAL_TABLET | Freq: Once | ORAL | Status: AC
  Administered 2013-10-05: 1 via ORAL
  Filled 2013-10-05: qty 1

## 2013-10-05 MED ORDER — PERCOCET 5-325 MG PO TABS: 1.0000 | ORAL_TABLET | Freq: Four times a day (QID) | ORAL | Status: DC | PRN

## 2013-10-05 MED ORDER — HYDROCODONE-ACETAMINOPHEN 5-325 MG PO TABS: 1.0000 | ORAL_TABLET | Freq: Four times a day (QID) | ORAL | Status: DC | PRN

## 2013-10-05 MED ORDER — IBUPROFEN 800 MG PO TABS: 800.0000 mg | ORAL_TABLET | Freq: Three times a day (TID) | ORAL | Status: DC | PRN

## 2013-10-05 MED ORDER — HYDROCODONE-ACETAMINOPHEN 5-325 MG PO TABS
1.0000 | ORAL_TABLET | Freq: Once | ORAL | Status: AC
  Administered 2013-10-05: 1 via ORAL
  Filled 2013-10-05: qty 1
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
## 2013-10-05 NOTE — Progress Notes (Signed)
[facility] provided pt with a list of primary care resources to help patient establish primary care.  °

## 2013-10-05 NOTE — ED Provider Notes (Signed)
Medical screening examination/treatment/procedure(s) were performed by non-physician practitioner and as supervising physician I was immediately available for consultation/collaboration.   EKG Interpretation None        Gilda Crease, MD 10/05/13 1241
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
## 2013-10-05 NOTE — Discharge Instructions (Signed)
Return here as needed.  Your x-rays were normal.  Followup with date primary care Dr. or urgent care

## 2014-07-19 ENCOUNTER — Encounter: Payer: Self-pay | Admitting: Family

## 2014-07-19 ENCOUNTER — Ambulatory Visit (INDEPENDENT_AMBULATORY_CARE_PROVIDER_SITE_OTHER): Payer: BLUE CROSS/BLUE SHIELD | Admitting: Family

## 2014-07-19 VITALS — BP 118/66 | HR 59 | Temp 98.2°F | Resp 16 | Ht 73.0 in | Wt 209.2 lb

## 2014-07-19 DIAGNOSIS — G35D Multiple sclerosis, unspecified: Secondary | ICD-10-CM

## 2014-07-19 DIAGNOSIS — N508 Other specified disorders of male genital organs: Secondary | ICD-10-CM

## 2014-07-19 DIAGNOSIS — K219 Gastro-esophageal reflux disease without esophagitis: Secondary | ICD-10-CM

## 2014-07-19 DIAGNOSIS — R739 Hyperglycemia, unspecified: Secondary | ICD-10-CM

## 2014-07-19 DIAGNOSIS — A630 Anogenital (venereal) warts: Secondary | ICD-10-CM

## 2014-07-19 DIAGNOSIS — R7989 Other specified abnormal findings of blood chemistry: Secondary | ICD-10-CM

## 2014-07-19 DIAGNOSIS — R312 Other microscopic hematuria: Secondary | ICD-10-CM

## 2014-07-19 DIAGNOSIS — R945 Abnormal results of liver function studies: Secondary | ICD-10-CM

## 2014-07-19 DIAGNOSIS — L918 Other hypertrophic disorders of the skin: Secondary | ICD-10-CM

## 2014-07-19 DIAGNOSIS — R55 Syncope and collapse: Secondary | ICD-10-CM

## 2014-07-19 DIAGNOSIS — R3129 Other microscopic hematuria: Secondary | ICD-10-CM | POA: Insufficient documentation

## 2014-07-19 DIAGNOSIS — G35 Multiple sclerosis: Secondary | ICD-10-CM

## 2014-07-19 DIAGNOSIS — B351 Tinea unguium: Secondary | ICD-10-CM

## 2014-07-19 DIAGNOSIS — N5089 Other specified disorders of the male genital organs: Secondary | ICD-10-CM

## 2014-07-19 LAB — BASIC METABOLIC PANEL
BUN: 17 mg/dL (ref 6–23)
CALCIUM: 9.8 mg/dL (ref 8.4–10.5)
CO2: 26 meq/L (ref 19–32)
Chloride: 105 mEq/L (ref 96–112)
Creatinine, Ser: 1.21 mg/dL (ref 0.40–1.50)
GFR: 89.95 mL/min (ref >60.00)
Glucose, Bld: 87 mg/dL (ref 70–99)
Potassium: 3.8 mEq/L (ref 3.5–5.1)
SODIUM: 137 meq/L (ref 135–145)

## 2014-07-19 LAB — HEPATIC FUNCTION PANEL
ALBUMIN: 4.6 g/dL (ref 3.5–5.2)
ALT: 29 U/L (ref 0–53)
AST: 23 U/L (ref 0–37)
Alkaline Phosphatase: 69 U/L (ref 39–117)
BILIRUBIN DIRECT: 0.1 mg/dL (ref 0.0–0.3)
BILIRUBIN TOTAL: 0.7 mg/dL (ref 0.2–1.2)
TOTAL PROTEIN: 7.3 g/dL (ref 6.0–8.3)

## 2014-07-19 LAB — URINALYSIS, ROUTINE W REFLEX MICROSCOPIC
BILIRUBIN URINE: NEGATIVE
Ketones, ur: NEGATIVE
LEUKOCYTES UA: NEGATIVE
NITRITE: NEGATIVE
PH: 7 (ref 5.0–8.0)
Specific Gravity, Urine: 1.025 (ref 1.000–1.030)
Total Protein, Urine: NEGATIVE
Urine Glucose: NEGATIVE
Urobilinogen, UA: 0.2 (ref 0.0–1.0)

## 2014-07-19 LAB — HEMOGLOBIN A1C: Hgb A1c MFr Bld: 5.2 % (ref 4.6–6.5)

## 2014-07-19 LAB — HIV ANTIBODY (ROUTINE TESTING W REFLEX): HIV 1&2 Ab, 4th Generation: NONREACTIVE

## 2014-07-19 LAB — RPR

## 2014-07-19 MED ORDER — VALACYCLOVIR HCL 1 G PO TABS: 1000.0000 mg | ORAL_TABLET | Freq: Two times a day (BID) | ORAL | Status: DC

## 2014-07-19 MED ORDER — IMIQUIMOD 5 % EX CREA
TOPICAL_CREAM | CUTANEOUS | Status: DC
Start: 2014-07-19 — End: 2015-05-30

## 2014-07-19 NOTE — Assessment & Plan Note (Signed)
Previous episode sounded like vasovagal syncope, monitor.

## 2014-07-19 NOTE — Assessment & Plan Note (Signed)
Pt will schedule appointment for removal of skin tag right groin
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
## 2014-07-19 NOTE — Patient Instructions (Addendum)
Please complete lab work prior to leaving. Start aldara cream to lesions on your penis. Start valtrex. Schedule a procedure visit for removal of skin. You will be contacted about your referral to neurology.  Please let us know if you have not heard back within 1 week about your referral. Welcome to [facility]!  Genital Warts Genital warts are a sexually transmitted infection. They may appear as small bumps on the tissues of the genital area. CAUSES  Genital warts are caused by a virus called human papillomavirus (HPV). HPV is the most common sexually transmitted disease (STD) and infection of the sex organs. This infection is spread by having unprotected sex with an infected person. It can be spread by vaginal, anal, and oral sex. Many people do not know they are infected. They may be infected for years without problems. However, even if they do not have problems, they can unknowingly pass the infection to their sexual partners. SYMPTOMS   Itching and irritation in the genital area.  Warts that bleed.  Painful sexual intercourse. DIAGNOSIS  Warts are usually recognized with the naked eye on the vagina, vulva, perineum, anus, and rectum. Certain tests can also diagnose genital warts, such as:  A Pap test.  A tissue sample (biopsy) exam.  Colposcopy. A magnifying tool is used to examine the vagina and cervix. The HPV cells will change color when certain solutions are used. TREATMENT  Warts can be removed by:  Applying certain chemicals, such as cantharidin or podophyllin.  Liquid nitrogen freezing (cryotherapy).  Immunotherapy with Candida or Trichophyton injections.  Laser treatment.  Burning with an electrified probe (electrocautery).  Interferon injections.  Surgery. PREVENTION  HPV vaccination can help prevent HPV infections that cause genital warts and that cause cancer of the cervix. It is recommended that the vaccination be given to people between the ages 35 to 57 years  old. The vaccine might not work as well or might not work at all if you already have HPV. It should not be given to pregnant women. HOME CARE INSTRUCTIONS   It is important to follow your caregiver's instructions. The warts will not go away without treatment. Repeat treatments are often needed to get rid of warts. Even after it appears that the warts are gone, the normal tissue underneath often remains infected.  Do not try to treat genital warts with medicine used to treat hand warts. This type of medicine is strong and can burn the skin in the genital area, causing more damage.  Tell your past and current sexual partner(s) that you have genital warts. They may be infected also and need treatment.  Avoid sexual contact while being treated.  Do not touch or scratch the warts. The infection may spread to other parts of your body.  Women with genital warts should have a cervical cancer check (Pap test) at least once a year. This type of cancer is slow-growing and can be cured if found early. Chances of developing cervical cancer are increased with HPV.  Inform your obstetrician about your warts in the event of pregnancy. This virus can be passed to the baby's respiratory tract. Discuss this with your caregiver.  Use a condom during sexual intercourse. Following treatment, the use of condoms will help prevent reinfection.  Ask your caregiver about using over-the-counter anti-itch creams. SEEK MEDICAL CARE IF:   Your treated skin becomes red, swollen, or painful.  You have a fever.  You feel generally ill.  You feel little lumps in and around your  genital area.  You are bleeding or have painful sexual intercourse. MAKE SURE YOU:   Understand these instructions.  Will watch your condition.  Will get help right away if you are not doing well or get worse. Document Released: 05/28/2000 Document Revised: 10/15/2013 Document Reviewed: 12/07/2010 [facility] Patient Information 2015  [place], [place]. This information is not intended to replace advice given to you by your health care provider. Make sure you discuss any questions you have with your health care provider.  Genital Herpes Genital herpes is a sexually transmitted disease. This means that it is a disease passed by having sex with an infected person. There is no cure for genital herpes. The time between attacks can be months to years. The virus may live in a person but produce no problems (symptoms). This infection can be passed to a baby as it travels down the birth canal (vagina). In a newborn, this can cause central nervous system damage, eye damage, or even death. The virus that causes genital herpes is usually HSV-2 virus. The virus that causes oral herpes is usually HSV-1. The diagnosis (learning what is wrong) is made through culture results. SYMPTOMS  Usually symptoms of pain and itching begin a few days to a week after contact. It first appears as small blisters that progress to small painful ulcers which then scab over and heal after several days. It affects the outer genitalia, birth canal, cervix, penis, anal area, buttocks, and thighs. HOME CARE INSTRUCTIONS   Keep ulcerated areas dry and clean.  Take medications as directed. Antiviral medications can speed up healing. They will not prevent recurrences or cure this infection. These medications can also be taken for suppression if there are frequent recurrences.  While the infection is active, it is contagious. Avoid all sexual contact during active infections.  Condoms may help prevent spread of the herpes virus.  Practice safe sex.  Wash your hands thoroughly after touching the genital area.  Avoid touching your eyes after touching your genital area.  Inform your caregiver if you have had genital herpes and become pregnant. It is your responsibility to insure a safe outcome for your baby in this pregnancy.  Only take over-the-counter or prescription  medicines for pain, discomfort, or fever as directed by your caregiver. SEEK MEDICAL CARE IF:   You have a recurrence of this infection.  You do not respond to medications and are not improving.  You have new sources of pain or discharge which have changed from the original infection.  You have an oral temperature above 102 F (38.9 C).  You develop abdominal pain.  You develop eye pain or signs of eye infection. Document Released: 05/28/2000 Document Revised: 08/23/2011 Document Reviewed: 06/18/2009 [facility] Patient Information 2015 [place], [place]. This information is not intended to replace advice given to you by your health care provider. Make sure you discuss any questions you have with your health care provider.

## 2014-07-19 NOTE — Assessment & Plan Note (Addendum)
Most consistent with HSV lesion.  Will also check RPR and HIV screen. Will check HSV IgG/IgM, rx with valtrex.

## 2014-07-19 NOTE — Assessment & Plan Note (Signed)
Need to check LFT, if normal, he would like to start oral lamisil.

## 2014-07-19 NOTE — Assessment & Plan Note (Signed)
New. Trial of aldara- if not improvement, will plan to freeze lesions

## 2014-07-19 NOTE — Assessment & Plan Note (Signed)
Obtain UA to evaluate for ongoing microscopic hematuria.

## 2014-07-19 NOTE — Assessment & Plan Note (Signed)
Stable on zantac, continue same.  

## 2014-07-19 NOTE — Assessment & Plan Note (Signed)
Stable, refer to neuro for ongoing management.

## 2014-07-19 NOTE — Progress Notes (Signed)
Pre visit review using our clinic review tool, if applicable. No additional management support is needed unless otherwise documented below in the visit note. 

## 2014-07-22 LAB — HSV 1 ANTIBODY, IGG

## 2014-07-22 LAB — HSV 2 ANTIBODY, IGG: HSV 2 GLYCOPROTEIN G AB, IGG: 2.41 IV — AB

## 2014-07-23 ENCOUNTER — Encounter: Payer: Self-pay | Admitting: Family

## 2014-07-23 ENCOUNTER — Telehealth: Payer: Self-pay | Admitting: *Deleted

## 2014-07-23 DIAGNOSIS — B009 Herpesviral infection, unspecified: Secondary | ICD-10-CM | POA: Insufficient documentation

## 2014-07-23 LAB — HSV(HERPES SIMPLEX VRS) I + II AB-IGM: Herpes Simplex Vrs I&II-IgM Ab (EIA): 0.72 INDEX

## 2014-07-23 MED ORDER — VALACYCLOVIR HCL 500 MG PO TABS
500.0000 mg | ORAL_TABLET | Freq: Two times a day (BID) | ORAL | Status: DC
Start: 2014-07-23 — End: 2015-10-13

## 2014-07-23 NOTE — Telephone Encounter (Signed)
-----   Message from Sandford Craze, NP sent at 07/23/2014  7:50 AM EST ----- HIV screen is negative.  Syphilis screen is negative.  As expected herpes type 2 is positive. Sugar, kidney function, liver testing is normal.

## 2014-07-23 NOTE — Telephone Encounter (Signed)
Left message on voicemail to return my call.  

## 2014-07-23 NOTE — Telephone Encounter (Signed)
Notified pt. He states valtrex rx he receives was for a 10 day course and he wanted to know if we will be sending in additional refills?  Please advise.

## 2014-07-23 NOTE — Telephone Encounter (Signed)
Refill sent. Dosing is different for recurrent flares and I made that change on the refill.

## 2014-07-24 ENCOUNTER — Telehealth: Payer: Self-pay | Admitting: Family

## 2014-07-24 DIAGNOSIS — B351 Tinea unguium: Secondary | ICD-10-CM

## 2014-07-24 MED ORDER — TERBINAFINE HCL 250 MG PO TABS
250.0000 mg | ORAL_TABLET | Freq: Every day | ORAL | Status: DC
Start: 2014-07-24 — End: 2014-10-21

## 2014-07-24 NOTE — Telephone Encounter (Signed)
Notified pt and he voices understanding. Lab appt scheduled for 09/04/14 at 4pm. Future lab order entered.

## 2014-07-24 NOTE — Telephone Encounter (Signed)
Notified pt. 

## 2014-07-24 NOTE — Telephone Encounter (Signed)
Please let pt know that I have reviewed his liver function tests and they are normal.  I will send rx for lamisil for toenail fungus. He needs to repeat LFT in 6 weeks, dx therapeutic drug monitoring. If this is normal, then we will give him the 3rd month refill. This is a 12 week treatment.

## 2014-08-19 ENCOUNTER — Ambulatory Visit: Payer: BLUE CROSS/BLUE SHIELD | Admitting: Family

## 2014-09-02 ENCOUNTER — Encounter: Payer: Self-pay | Admitting: Family

## 2014-09-02 ENCOUNTER — Ambulatory Visit (INDEPENDENT_AMBULATORY_CARE_PROVIDER_SITE_OTHER): Payer: BLUE CROSS/BLUE SHIELD | Admitting: Family

## 2014-09-02 VITALS — BP 130/82 | HR 89 | Temp 97.6°F | Resp 16 | Ht 73.0 in | Wt 213.0 lb

## 2014-09-02 DIAGNOSIS — A630 Anogenital (venereal) warts: Secondary | ICD-10-CM | POA: Diagnosis not present

## 2014-09-02 NOTE — Progress Notes (Signed)
   Subjective:    Patient ID: Shaun Bentley, male    DOB: Jan 20, 1986, 29 y.o.   MRN: [id number]  HPI  Shaun Bentley is a 29 yr old male who presents today for 1 month follow up and skin tag removal. Last visit he was noted to have penile genital warts and was given rx for aldara.  He was also noted to have HSV2 lesion which was confirmed by serology. He reports that warts have decreased in size since he started the aldara.   Review of Systems     Objective:   Physical Exam  Constitutional: He appears well-developed and well-nourished.  Skin:  Large "skin tag" right groin, is much smaller  Resolution of small warts on glans penis. Several warts noted on shaft.            Assessment & Plan:

## 2014-09-02 NOTE — Patient Instructions (Addendum)
You may take tylenol tonight as needed for pain.  Follow up in 1 month for retreatment if lesions are not resolved or if recurrent lesions occur. Please schedule a fasting physical at the front desk.

## 2014-09-02 NOTE — Progress Notes (Signed)
Pre visit review using our clinic review tool, if applicable. No additional management support is needed unless otherwise documented below in the visit note. 

## 2014-09-04 ENCOUNTER — Other Ambulatory Visit: Payer: BLUE CROSS/BLUE SHIELD

## 2014-09-08 NOTE — Assessment & Plan Note (Signed)
After verbal consent was obtained, 6 genital warts were frozen using liquid nitrogen.  Pt tolerated procedure well. Advised pt to follow up in 1 month for retreatment if warts are not resolved. Tylenol prn today as needed for discomfort.

## 2014-09-11 ENCOUNTER — Ambulatory Visit: Payer: BLUE CROSS/BLUE SHIELD | Admitting: Neurology

## 2014-09-25 ENCOUNTER — Encounter: Payer: Self-pay | Admitting: *Deleted

## 2014-10-02 ENCOUNTER — Ambulatory Visit: Payer: BLUE CROSS/BLUE SHIELD | Admitting: Family

## 2014-10-03 ENCOUNTER — Telehealth: Payer: Self-pay | Admitting: Family

## 2014-10-03 NOTE — Telephone Encounter (Signed)
Pt called morning of appt to reschedule- no letter sent- charge?

## 2014-10-03 NOTE — Telephone Encounter (Signed)
No

## 2014-10-07 ENCOUNTER — Ambulatory Visit: Payer: BLUE CROSS/BLUE SHIELD | Admitting: Family

## 2014-10-16 ENCOUNTER — Ambulatory Visit: Payer: BLUE CROSS/BLUE SHIELD | Admitting: Neurology

## 2014-10-21 ENCOUNTER — Encounter: Payer: Self-pay | Admitting: Family

## 2014-10-21 ENCOUNTER — Ambulatory Visit (INDEPENDENT_AMBULATORY_CARE_PROVIDER_SITE_OTHER): Payer: BLUE CROSS/BLUE SHIELD | Admitting: Family

## 2014-10-21 VITALS — BP 118/70 | HR 76 | Temp 98.1°F | Resp 16 | Ht 73.0 in | Wt 209.2 lb

## 2014-10-21 DIAGNOSIS — N63 Unspecified lump in unspecified breast: Secondary | ICD-10-CM

## 2014-10-21 DIAGNOSIS — A630 Anogenital (venereal) warts: Secondary | ICD-10-CM | POA: Diagnosis not present

## 2014-10-21 DIAGNOSIS — Z5181 Encounter for therapeutic drug level monitoring: Secondary | ICD-10-CM

## 2014-10-21 DIAGNOSIS — G35 Multiple sclerosis: Secondary | ICD-10-CM

## 2014-10-21 DIAGNOSIS — B351 Tinea unguium: Secondary | ICD-10-CM

## 2014-10-21 MED ORDER — CEPHALEXIN 500 MG PO CAPS: 500.0000 mg | ORAL_CAPSULE | Freq: Three times a day (TID) | ORAL | Status: DC

## 2014-10-21 MED ORDER — ITRACONAZOLE 200 MG PO TABS
200.0000 mg | ORAL_TABLET | Freq: Every day | ORAL | Status: DC
Start: 2014-10-21 — End: 2015-05-30

## 2014-10-21 NOTE — Patient Instructions (Addendum)
Start Keflex for left breast. You will be contacted about your ultrasound and mammogram. You may use lamisil powder on your feet in the AM- this is available otc. Follow up in AM for blood test (liver check) if OK we will let you know so you can begin the itraconazole for your toenails. (stop lamisil) Follow up in 2 weeks for office visit. Follow up in 4 weeks for repeat blood draw (need to check your liver)
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
## 2014-10-21 NOTE — Progress Notes (Signed)
Pre visit review using our clinic review tool, if applicable. No additional management support is needed unless otherwise documented below in the visit note. 

## 2014-10-22 DIAGNOSIS — N63 Unspecified lump in unspecified breast: Secondary | ICD-10-CM | POA: Insufficient documentation

## 2014-10-22 NOTE — Assessment & Plan Note (Signed)
Obtain LFT, if ok, will plan rx with sporanox given lamisil failure. Advised pt to return to lab in 4 weeks after starting lamisil for liver function testing.

## 2014-10-22 NOTE — Assessment & Plan Note (Signed)
Resolved

## 2014-10-22 NOTE — Assessment & Plan Note (Signed)
Encouraged pt to keep his upcoming apt.

## 2014-10-22 NOTE — Assessment & Plan Note (Signed)
Will obtain US and mammogram. Will also rx with keflex to treat for possible infectious etiology.

## 2014-10-27 ENCOUNTER — Telehealth: Payer: Self-pay | Admitting: Family

## 2014-10-27 NOTE — Telephone Encounter (Signed)
Could you please check status of breast imaging scheduling?

## 2014-10-28 NOTE — Telephone Encounter (Signed)
Order was refaxed, awaiting appt

## 2014-12-02 ENCOUNTER — Ambulatory Visit (INDEPENDENT_AMBULATORY_CARE_PROVIDER_SITE_OTHER): Payer: BLUE CROSS/BLUE SHIELD | Admitting: Neurology

## 2014-12-02 ENCOUNTER — Encounter: Payer: Self-pay | Admitting: Neurology

## 2014-12-02 VITALS — BP 126/74 | HR 64 | Resp 20 | Ht 73.0 in | Wt 216.2 lb

## 2014-12-02 DIAGNOSIS — G35 Multiple sclerosis: Secondary | ICD-10-CM | POA: Diagnosis not present

## 2014-12-02 NOTE — Patient Instructions (Addendum)
Check MRI of brain and cervical spine with and without contrast. [facility]  12/11/14 11:45am Further testing pending results Follow up in 6 weeks
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
## 2014-12-11 ENCOUNTER — Ambulatory Visit (HOSPITAL_COMMUNITY): Payer: BLUE CROSS/BLUE SHIELD | Attending: Neurology

## 2014-12-11 ENCOUNTER — Ambulatory Visit (HOSPITAL_COMMUNITY): Admission: RE | Admit: 2014-12-11 | Payer: BLUE CROSS/BLUE SHIELD | Source: Ambulatory Visit | Admitting: Neurology

## 2014-12-18 ENCOUNTER — Ambulatory Visit: Payer: BLUE CROSS/BLUE SHIELD | Admitting: Family

## 2014-12-30 ENCOUNTER — Encounter: Payer: Self-pay | Admitting: Family

## 2014-12-30 ENCOUNTER — Ambulatory Visit (INDEPENDENT_AMBULATORY_CARE_PROVIDER_SITE_OTHER): Payer: BLUE CROSS/BLUE SHIELD | Admitting: Family

## 2014-12-30 ENCOUNTER — Telehealth: Payer: Self-pay | Admitting: Family

## 2014-12-30 VITALS — BP 116/78 | HR 67 | Temp 98.2°F | Resp 16 | Ht 73.0 in | Wt 213.6 lb

## 2014-12-30 DIAGNOSIS — L819 Disorder of pigmentation, unspecified: Secondary | ICD-10-CM | POA: Diagnosis not present

## 2014-12-30 DIAGNOSIS — N63 Unspecified lump in unspecified breast: Secondary | ICD-10-CM

## 2014-12-30 DIAGNOSIS — N529 Male erectile dysfunction, unspecified: Secondary | ICD-10-CM

## 2014-12-30 MED ORDER — SILDENAFIL CITRATE 50 MG PO TABS: 50.0000 mg | ORAL_TABLET | Freq: Every day | ORAL | Status: DC | PRN
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
## 2014-12-30 NOTE — Patient Instructions (Addendum)
Please schedule an AM lab visit to have testosterone level drawn. Apply sunscreen when you are in the sun. Let me know if you have not heard back about your breast imaging in 1 week.   Follow up in 3 months, sooner if problems/concerns.

## 2014-12-30 NOTE — Telephone Encounter (Signed)
Pt states never contacted by breast center re: mammogram. Could you please contact breast center and make sure that this gets scheduled? Thanks!

## 2014-12-30 NOTE — Progress Notes (Signed)
Pre visit review using our clinic review tool, if applicable. No additional management support is needed unless otherwise documented below in the visit note. 

## 2014-12-31 NOTE — Telephone Encounter (Signed)
Breast Center call patient to schedule today/awaiting call back from patient

## 2015-01-05 DIAGNOSIS — L819 Disorder of pigmentation, unspecified: Secondary | ICD-10-CM | POA: Insufficient documentation

## 2015-01-05 DIAGNOSIS — N529 Male erectile dysfunction, unspecified: Secondary | ICD-10-CM | POA: Insufficient documentation

## 2015-01-05 NOTE — Assessment & Plan Note (Signed)
Trial of viagra. Obtain serum testosterone level.

## 2015-01-05 NOTE — Assessment & Plan Note (Signed)
Unchanged. Will check status of breast imaging.

## 2015-01-05 NOTE — Assessment & Plan Note (Signed)
Advised pt to wear sunscreen and or avoid the sun.

## 2015-01-07 ENCOUNTER — Telehealth: Payer: Self-pay | Admitting: Family

## 2015-01-07 ENCOUNTER — Encounter: Payer: Self-pay | Admitting: Family

## 2015-01-07 NOTE — Telephone Encounter (Signed)
Opened in error

## 2015-01-07 NOTE — Telephone Encounter (Signed)
See Letter.

## 2015-03-18 ENCOUNTER — Ambulatory Visit: Payer: BLUE CROSS/BLUE SHIELD | Admitting: Neurology

## 2015-05-02 ENCOUNTER — Telehealth: Payer: Self-pay | Admitting: Family

## 2015-05-02 ENCOUNTER — Ambulatory Visit: Payer: BLUE CROSS/BLUE SHIELD | Admitting: Physician Assistant
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
## 2015-05-05 NOTE — Telephone Encounter (Signed)
Shaun Bentley--Pt was scheduled to see you on 05/02/15.  Please advise?

## 2015-05-05 NOTE — Telephone Encounter (Signed)
No charge. 

## 2015-05-23 ENCOUNTER — Ambulatory Visit: Payer: BLUE CROSS/BLUE SHIELD | Admitting: Medical

## 2015-05-23 ENCOUNTER — Encounter: Payer: Self-pay | Admitting: Medical

## 2015-05-23 ENCOUNTER — Ambulatory Visit (INDEPENDENT_AMBULATORY_CARE_PROVIDER_SITE_OTHER): Payer: BLUE CROSS/BLUE SHIELD | Admitting: Medical

## 2015-05-23 ENCOUNTER — Telehealth: Payer: Self-pay | Admitting: Medical

## 2015-05-23 ENCOUNTER — Ambulatory Visit (HOSPITAL_BASED_OUTPATIENT_CLINIC_OR_DEPARTMENT_OTHER)
Admission: RE | Admit: 2015-05-23 | Discharge: 2015-05-23 | Disposition: A | Discharge status: Home / Self Care | Payer: BLUE CROSS/BLUE SHIELD | Source: Ambulatory Visit | Attending: Medical | Admitting: Medical

## 2015-05-23 VITALS — BP 125/79 | HR 60 | Temp 97.5°F | Ht 73.0 in | Wt 216.6 lb

## 2015-05-23 DIAGNOSIS — G245 Blepharospasm: Secondary | ICD-10-CM | POA: Diagnosis not present

## 2015-05-23 DIAGNOSIS — R55 Syncope and collapse: Secondary | ICD-10-CM | POA: Insufficient documentation

## 2015-05-23 DIAGNOSIS — R519 Headache, unspecified: Secondary | ICD-10-CM

## 2015-05-23 DIAGNOSIS — R51 Headache: Secondary | ICD-10-CM | POA: Insufficient documentation

## 2015-05-23 MED ORDER — CYCLOBENZAPRINE HCL 5 MG PO TABS: ORAL_TABLET | ORAL | Status: DC

## 2015-05-23 MED ORDER — SUMATRIPTAN SUCCINATE 50 MG PO TABS: ORAL_TABLET | ORAL | Status: DC

## 2015-05-23 NOTE — Progress Notes (Signed)
Pre visit review using our clinic review tool, if applicable. No additional management support is needed unless otherwise documented below in the visit note. 
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
## 2015-05-23 NOTE — Telephone Encounter (Signed)
I did talk with pt and advised him of the negative ct of head results.

## 2015-05-23 NOTE — Patient Instructions (Addendum)
For your headache which was severe earlier with some worrisome features. I want to get ct of your head done stat today. Please go down now. Stay down in radiology until I get the stat results. Then I will discuss results with you today.  If you have recurrent severe ha today or this weekend  then would recommend evaluation at the main ED.  If you start to get severe ha this weekend will rx imitrex to take in event migraine type ha.(sound or light sensitivity).  If you have ha with neck tightness could also try muscle relaxant.  For mild headache use alleve otc.  Stop goody powder.  I apologize for not handling your various other complaints but you need to get the stat ct now. Please schedule with Melissa for other issues or I will be happy to see you for another visit.  Follow up in 1 wk to discuss how you responded to meds.

## 2015-05-25 ENCOUNTER — Telehealth: Payer: Self-pay | Admitting: Medical
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
## 2015-05-26 ENCOUNTER — Telehealth: Payer: Self-pay

## 2015-05-26 DIAGNOSIS — R51 Headache: Principal | ICD-10-CM

## 2015-05-26 DIAGNOSIS — R519 Headache, unspecified: Secondary | ICD-10-CM

## 2015-05-26 NOTE — Telephone Encounter (Signed)
Spoke with pt. He is agreeable to proceed with mammogram. Have scheduled mammogram and u/s for 05/30/15 at 2:10 at the [facility]  Will need to tell pt when we call him back. States that the pharmacy told him he should only be taking cyclobenzaprine 4 times a month. Pt states he has taken 2 but he is wanting something he can take every day and work. States he is having severe headaches daily, no n/v but is sensitive to light sound, has facial / eye twitching and states they just "shut him down". Please advise?

## 2015-05-26 NOTE — Telephone Encounter (Signed)
Notified pt of below recommendation. Pt states he saw neurology once for his MS and has not returned. Office contact information given to pt to call and arrange appt to discuss headaches. Lab appt scheduled for tomorrow at 4pm. Lab order entered. Mammogram and ultrasound info given to pt as per previous phone note.  Advised pt to call if he has any problems scheduling neuro appt and he voices understanding.

## 2015-05-26 NOTE — Telephone Encounter (Signed)
Nicki Guadalajara, could you please contact patient and ask him to reschedule his mammogram? I believe the order is still active. thanks

## 2015-05-26 NOTE — Telephone Encounter (Signed)
Patient called and wanted to voice a complaint about his experience in our office Friday 05/23/15 at his appointment with Ramon Dredge. Patient stated he came in office with a complaint of a severe headache and eye twitching. He said Ramon Dredge rushed through the appointment when Mr. Erkkila tried to bring up an issue with his chest being swollen Ramon Dredge told him he did not have time for that. He told the patient that he had a 15 minute appointment and he would not be address any other issues expect for what he came in for because he would get behind and his other patients would be mad with him. Patient says he is very upset because he paid his $30 copay and was not aware that we only gave him 15 minutes because Efraim Kaufmann has never said anything to him about time or acted as if she was in a hurry. He said Efraim Kaufmann is very informative and takes her time with him. He said that EMS has accessed him before his visit and said he was not having a stroke so patient decided to come in for a visit. He was not aware that he would not be treated for his issue. 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 This medication did not work and he feels he wasted his money on the copay and medication because his issue is not resolved.  He is still having the migraine and eye twitches  He does not feel it is right that he would have to pay another copay to come be treated for the say issue. Wants to know what Efraim Kaufmann thinks about his encounter. Asked if she could look at the notes from 05/915 and see if there is any recommendations for this persistent health concern he has.

## 2015-05-26 NOTE — Telephone Encounter (Signed)
Please let patient know that in terms of daily meds,  He may use tylenol, aleve, imitrex prn.  I would recommend that he return to our lab for a sed rate to rule out autoimmune etiology for his HA.  Also, did he see neuro?  I would recommend that he follow up with his neurologist. Happy to see him back in the office if he wishes.

## 2015-05-27 ENCOUNTER — Other Ambulatory Visit (INDEPENDENT_AMBULATORY_CARE_PROVIDER_SITE_OTHER): Payer: BLUE CROSS/BLUE SHIELD

## 2015-05-27 DIAGNOSIS — R51 Headache: Secondary | ICD-10-CM | POA: Diagnosis not present

## 2015-05-27 DIAGNOSIS — R519 Headache, unspecified: Secondary | ICD-10-CM

## 2015-05-28 LAB — SEDIMENTATION RATE: Sed Rate: 5 mm/hr (ref 0–22)

## 2015-05-28 NOTE — Telephone Encounter (Signed)
Pt called back stating his neurologist cannot see him until April. Scheduled pt with PCP for Friday at 7am; can discuss new referral at that time. Pt wants to discuss medication for migraines at that time.

## 2015-05-29 ENCOUNTER — Telehealth: Payer: Self-pay | Admitting: Family

## 2015-05-29 NOTE — Telephone Encounter (Signed)
Tried to contact patient to follow up on his headache symptoms.  Left detailed message on cell advising pt to schedule follow up appointment in our office if headache is not improved. Advised pt to try imitrex if he has not already tried for headache.

## 2015-05-30 ENCOUNTER — Ambulatory Visit (INDEPENDENT_AMBULATORY_CARE_PROVIDER_SITE_OTHER): Payer: BLUE CROSS/BLUE SHIELD | Admitting: Family

## 2015-05-30 ENCOUNTER — Encounter: Payer: Self-pay | Admitting: Family

## 2015-05-30 ENCOUNTER — Ambulatory Visit: Payer: BLUE CROSS/BLUE SHIELD

## 2015-05-30 VITALS — BP 122/64 | HR 61 | Temp 98.2°F | Resp 16 | Ht 73.0 in | Wt 212.6 lb

## 2015-05-30 DIAGNOSIS — G43809 Other migraine, not intractable, without status migrainosus: Secondary | ICD-10-CM | POA: Diagnosis not present

## 2015-05-30 DIAGNOSIS — G43909 Migraine, unspecified, not intractable, without status migrainosus: Secondary | ICD-10-CM | POA: Insufficient documentation

## 2015-05-30 DIAGNOSIS — N63 Unspecified lump in unspecified breast: Secondary | ICD-10-CM

## 2015-05-30 MED ORDER — AMITRIPTYLINE HCL 25 MG PO TABS
25.0000 mg | ORAL_TABLET | Freq: Every day | ORAL | Status: DC
Start: 2015-05-30 — End: 2015-10-13

## 2015-05-30 MED ORDER — CYCLOBENZAPRINE HCL 5 MG PO TABS: ORAL_TABLET | ORAL | Status: DC

## 2015-05-30 NOTE — Patient Instructions (Addendum)
Start Elavil  at bedtime. You may use imitrex as needed for migraine. Avoid use of goody powders.   Please keep upcoming appointment this afternoon with the breast center.  Keep upcoming appointment with Dr. Everlena Cooper.

## 2015-05-30 NOTE — Progress Notes (Signed)
Pre visit review using our clinic review tool, if applicable. No additional management support is needed unless otherwise documented below in the visit note. 

## 2015-05-30 NOTE — Assessment & Plan Note (Signed)
Pt has breast imaging scheduled today at the breast center.
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
## 2015-05-30 NOTE — Assessment & Plan Note (Addendum)
Uncontrolled. Trial of elavil QHS- pt has follow up with neuro and is on wait list for sooner appointment. Offered IM toradol today in the office- patient declined. Continue prn imitrex, prn flexeril (not before driving- pt is aware).

## 2015-06-02 ENCOUNTER — Telehealth: Payer: Self-pay | Admitting: *Deleted

## 2015-06-02 ENCOUNTER — Telehealth: Payer: Self-pay | Admitting: Family

## 2015-06-02 MED ORDER — ELETRIPTAN HYDROBROMIDE 20 MG PO TABS: ORAL_TABLET | ORAL | Status: DC

## 2015-06-02 NOTE — Telephone Encounter (Signed)
Pt called stating he has had to leave work for a couple of hours today due to another migraine. States he took 6-8 ibuprofen and a goody powder and is still in pain. He has already used the imitrex from 05/23/15 Rx. Not sure if insurance will allow another refill this soon. Pt states he needs something equivalent to give him immediate relief when migraine starts. Please advise?

## 2015-06-02 NOTE — Telephone Encounter (Signed)
Notified pt and he voices understanding. 

## 2015-06-02 NOTE — Telephone Encounter (Signed)
Please advise pt not to take more than  of ibuprofen at a time.  I will send an rx for a similar medication for him to try in place of the imitrex (relpax) and I will send a note to Dr. [name] to see if neurology can get him  In sooner since his headaches are so poorly controlled.

## 2015-06-02 NOTE — Telephone Encounter (Signed)
Pt called stating he's waiting for a call back for pain meds for migraines. Pt said he got appt with Dr. Everlena Cooper for this Thursday. I advised him new RX was sent to [facility] this morning.

## 2015-06-02 NOTE — Telephone Encounter (Signed)
-----   Message from Drema Dallas, DO sent at 06/02/2015 11:22 AM EST ----- Shaun Bentley   We can put him on the wait list, but that is about it since I have several people also waiting to see me.  When I saw him for his MS, he never had the tests I ordered performed or followed up with me as recommended.  Adam ----- Message -----    From: Sandford Craze, NP    Sent: 06/02/2015  11:07 AM      To: Drema Dallas, DO  Dr. Everlena Cooper,  You have seen Mr. Sandvik in the past for his MS.  We are really struggling with his recent "migraines." He has an appointment scheduled with you in April which was the soonest appointment your office could provide him. Would you be able to work him in sooner possibly?    Thanks so much! Happy Holidays!  Rufina Kimery

## 2015-06-02 NOTE — Telephone Encounter (Signed)
error 

## 2015-06-04 ENCOUNTER — Telehealth: Payer: Self-pay | Admitting: *Deleted

## 2015-06-04 NOTE — Telephone Encounter (Signed)
Received fax from [facility] (dated 06/02/15) that relpax will cost pt $180 and requests cheaper alternative.  Please advise?

## 2015-06-04 NOTE — Telephone Encounter (Signed)
I see he has apt tomorrow AM with neurology. Will defer med changes to neurology.

## 2015-06-05 ENCOUNTER — Other Ambulatory Visit (INDEPENDENT_AMBULATORY_CARE_PROVIDER_SITE_OTHER): Payer: BLUE CROSS/BLUE SHIELD

## 2015-06-05 ENCOUNTER — Ambulatory Visit (INDEPENDENT_AMBULATORY_CARE_PROVIDER_SITE_OTHER): Payer: BLUE CROSS/BLUE SHIELD | Admitting: Neurology

## 2015-06-05 ENCOUNTER — Encounter: Payer: Self-pay | Admitting: Neurology

## 2015-06-05 VITALS — BP 120/72 | HR 68 | Ht 72.0 in | Wt 217.0 lb

## 2015-06-05 DIAGNOSIS — G35 Multiple sclerosis: Secondary | ICD-10-CM

## 2015-06-05 DIAGNOSIS — F17200 Nicotine dependence, unspecified, uncomplicated: Secondary | ICD-10-CM | POA: Insufficient documentation

## 2015-06-05 DIAGNOSIS — G43019 Migraine without aura, intractable, without status migrainosus: Secondary | ICD-10-CM

## 2015-06-05 LAB — BASIC METABOLIC PANEL
BUN: 19 mg/dL (ref 6–23)
CALCIUM: 9.5 mg/dL (ref 8.4–10.5)
CHLORIDE: 107 meq/L (ref 96–112)
CO2: 30 meq/L (ref 19–32)
CREATININE: 1.45 mg/dL (ref 0.40–1.50)
GFR: 72.59 mL/min (ref >60.00)
GLUCOSE: 83 mg/dL (ref 70–99)
Potassium: 3.9 mEq/L (ref 3.5–5.1)
Sodium: 140 mEq/L (ref 135–145)

## 2015-06-05 MED ORDER — NAPROXEN 500 MG PO TABS: ORAL_TABLET | ORAL | Status: DC

## 2015-06-05 MED ORDER — PREDNISONE 10 MG PO TABS: ORAL_TABLET | ORAL | Status: DC

## 2015-06-05 MED ORDER — SUMATRIPTAN SUCCINATE 100 MG PO TABS: ORAL_TABLET | ORAL | Status: DC

## 2015-06-05 NOTE — Telephone Encounter (Signed)
Spoke with the patient and verified that his Medication was changed today, he was given Imitrex at a quantity of 10 but is having Migraines daily, he failed to discuss with Neuro at his visit, he said he forgot. I suggested that he gives them a call tomorrow to discuss and he has agreed to do so.     KP
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
## 2015-06-05 NOTE — Patient Instructions (Addendum)
1.  Continue amitriptyline 25mg  at bedtime for now.  When you start to run out, call us and we can increase dose if needed. 2.  To help break headache, we will start you on a prednisone taper.  Take 6tabs x1day, then 5tabs x1day, then 4tabs x1day, then 3tabs x1day, then 2tabs x1day, then 1tab x1day, then STOP 3.  Take sumatriptan 100mg  at earliest onset of headache.  May repeat dose once in 2 hours if needed.  Do not exceed 2 pills in 24 hours.  Once you are off of the prednisone, you may take each dose of sumatriptan along with naproxen 500mg . 4.  Stop Goodys.  Limit use of all pain relievers to no more than 2 days out of the week 5.  MRI of brain and cervical spine with and without contrast 6.  Follow up in 3 months but CALL IN 4 WEEKS WITH UPDATE.

## 2015-06-19 ENCOUNTER — Other Ambulatory Visit: Payer: BLUE CROSS/BLUE SHIELD

## 2015-07-22 ENCOUNTER — Inpatient Hospital Stay: Admission: RE | Admit: 2015-07-22 | Payer: BLUE CROSS/BLUE SHIELD | Source: Ambulatory Visit

## 2015-07-22 ENCOUNTER — Other Ambulatory Visit: Payer: BLUE CROSS/BLUE SHIELD

## 2015-09-05 ENCOUNTER — Ambulatory Visit: Payer: BLUE CROSS/BLUE SHIELD | Admitting: Neurology

## 2015-09-05 DIAGNOSIS — Z029 Encounter for administrative examinations, unspecified: Secondary | ICD-10-CM

## 2015-09-25 ENCOUNTER — Ambulatory Visit: Payer: BLUE CROSS/BLUE SHIELD | Admitting: Neurology

## 2015-10-13 ENCOUNTER — Ambulatory Visit (INDEPENDENT_AMBULATORY_CARE_PROVIDER_SITE_OTHER): Payer: BLUE CROSS/BLUE SHIELD | Admitting: Family

## 2015-10-13 ENCOUNTER — Encounter: Payer: Self-pay | Admitting: Family

## 2015-10-13 VITALS — BP 113/57 | HR 78 | Temp 98.6°F | Resp 16 | Ht 73.0 in | Wt 218.4 lb

## 2015-10-13 DIAGNOSIS — M25511 Pain in right shoulder: Secondary | ICD-10-CM | POA: Diagnosis not present

## 2015-10-13 DIAGNOSIS — A630 Anogenital (venereal) warts: Secondary | ICD-10-CM

## 2015-10-13 DIAGNOSIS — N63 Unspecified lump in breast: Secondary | ICD-10-CM | POA: Diagnosis not present

## 2015-10-13 DIAGNOSIS — N6320 Unspecified lump in the left breast, unspecified quadrant: Secondary | ICD-10-CM

## 2015-10-13 DIAGNOSIS — G43809 Other migraine, not intractable, without status migrainosus: Secondary | ICD-10-CM | POA: Diagnosis not present

## 2015-10-13 MED ORDER — EPINEPHRINE 0.3 MG/0.3ML IJ SOAJ: 0.3000 mg | INTRAMUSCULAR | Status: DC | PRN

## 2015-10-13 MED ORDER — MELOXICAM 7.5 MG PO TABS: 7.5000 mg | ORAL_TABLET | Freq: Every day | ORAL | Status: DC

## 2015-10-13 NOTE — Progress Notes (Signed)
Pre visit review using our clinic review tool, if applicable. No additional management support is needed unless otherwise documented below in the visit note. 

## 2015-10-13 NOTE — Patient Instructions (Signed)
Please begin meloxicam for your right shoulder pain. We will arrange an appointment with sports medicine for further evaluation. Please schedule your mammogram at the breast center.
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
## 2015-10-13 NOTE — Assessment & Plan Note (Signed)
Stable/improved

## 2015-10-14 ENCOUNTER — Ambulatory Visit: Payer: BLUE CROSS/BLUE SHIELD | Admitting: Family

## 2015-10-15 ENCOUNTER — Telehealth: Payer: Self-pay | Admitting: Family

## 2015-10-15 DIAGNOSIS — N63 Unspecified lump in unspecified breast: Secondary | ICD-10-CM

## 2015-10-15 NOTE — Telephone Encounter (Signed)
Need to change mammo order per breast center.

## 2015-10-16 ENCOUNTER — Encounter: Payer: Self-pay | Admitting: Family Medicine

## 2015-10-16 ENCOUNTER — Ambulatory Visit (INDEPENDENT_AMBULATORY_CARE_PROVIDER_SITE_OTHER): Payer: BLUE CROSS/BLUE SHIELD | Admitting: Family Medicine

## 2015-10-16 VITALS — BP 118/82 | HR 68 | Ht 73.0 in | Wt 220.0 lb

## 2015-10-16 DIAGNOSIS — M25511 Pain in right shoulder: Secondary | ICD-10-CM | POA: Insufficient documentation

## 2015-10-16 MED ORDER — MELOXICAM 15 MG PO TABS: 15.0000 mg | ORAL_TABLET | Freq: Every day | ORAL | Status: DC

## 2015-10-16 NOTE — Assessment & Plan Note (Signed)
consistent with rotator cuff impingement and biceps tendinitis.  Start with home exercise program, meloxicam daily.  Consider injection, physical therapy, nitro, further imaging if not improving.  F/u in 6 weeks.

## 2015-10-16 NOTE — Patient Instructions (Signed)
You have rotator cuff impingement and biceps tendinitis. Try to avoid painful activities (overhead activities, lifting with extended arm) as much as possible. Meloxicam  daily with food for pain and inflammation. Can take tylenol in addition to this. Subacromial injection may be beneficial to help with pain and to decrease inflammation. Consider physical therapy with transition to home exercise program. Do home exercise program with theraband and scapular stabilization exercises daily - these are very important for long term relief even if an injection was given.  3 sets of 10 - include biceps curl and hammer rotation exercise we discusses If not improving at follow-up we will consider further imaging, injection, physical therapy, and/or nitro patches. Follow up with me in 6 weeks for reevaluation but call me sooner if you're struggling.
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
## 2015-11-14 ENCOUNTER — Ambulatory Visit
Admission: RE | Admit: 2015-11-14 | Discharge: 2015-11-14 | Disposition: A | Discharge status: Home / Self Care | Payer: BLUE CROSS/BLUE SHIELD | Source: Ambulatory Visit | Attending: Family | Admitting: Family

## 2015-11-14 ENCOUNTER — Other Ambulatory Visit: Payer: Self-pay | Admitting: Family

## 2015-11-14 DIAGNOSIS — N6320 Unspecified lump in the left breast, unspecified quadrant: Secondary | ICD-10-CM

## 2015-11-14 DIAGNOSIS — N63 Unspecified lump in unspecified breast: Secondary | ICD-10-CM

## 2015-11-25 ENCOUNTER — Ambulatory Visit: Payer: BLUE CROSS/BLUE SHIELD | Admitting: Family Medicine

## 2015-11-28 ENCOUNTER — Encounter: Payer: Self-pay | Admitting: Family Medicine

## 2015-11-28 ENCOUNTER — Ambulatory Visit (INDEPENDENT_AMBULATORY_CARE_PROVIDER_SITE_OTHER): Payer: BLUE CROSS/BLUE SHIELD | Admitting: Family Medicine

## 2015-11-28 VITALS — BP 121/74 | HR 81 | Ht 73.0 in | Wt 212.0 lb

## 2015-11-28 DIAGNOSIS — M25562 Pain in left knee: Secondary | ICD-10-CM

## 2015-11-28 DIAGNOSIS — M25511 Pain in right shoulder: Secondary | ICD-10-CM

## 2015-11-28 DIAGNOSIS — M25561 Pain in right knee: Secondary | ICD-10-CM

## 2015-11-28 MED ORDER — MELOXICAM 15 MG PO TABS: 15.0000 mg | ORAL_TABLET | Freq: Every day | ORAL | Status: DC

## 2015-11-28 MED ORDER — NITROGLYCERIN 0.2 MG/HR TD PT24: MEDICATED_PATCH | TRANSDERMAL | Status: DC

## 2015-11-28 NOTE — Patient Instructions (Signed)
You have rotator cuff impingement and biceps tendinitis. Try to avoid painful activities (overhead activities, lifting with extended arm) as much as possible. Meloxicam 15mg  daily with food for pain and inflammation as needed now. Nitro patches - 1/4th patch over affected shoulder, change daily. Can take tylenol in addition to this. Subacromial injection may be beneficial to help with pain and to decrease inflammation. Consider physical therapy with transition to home exercise program. Continue home exercise program with theraband and scapular stabilization exercises daily - these are very important for long term relief even if an injection was given.  3 sets of 10 - include biceps curl and hammer rotation exercise we discusses If not improving at follow-up we will consider further imaging, injection, physical therapy. Follow up with me in 6 weeks for reevaluation but call me sooner if you're struggling.  You have patellofemoral syndrome Avoid painful activities when possible (often deep squats, lunges bother this) Cross train with swimming, cycling with low resistance, elliptical if needed. Decline half-squat, straight leg raise, hip side raises, straight leg raises with foot turned outwards 3 sets of 10 once a day. Add ankle weight if thesee become too easy. Consider formal physical therapy Correct foot breakdown with something like dr. Jari Sportsman active series, spencos, superfeet. Avoid flat shoes, barefoot walking as much as possible the next 6 weeks. Icing 15 minutes at a time 3-4 times a day as needed. Meloxicam as noted above. Follow up with me in 6 weeks.

## 2015-12-05 DIAGNOSIS — M25561 Pain in right knee: Secondary | ICD-10-CM | POA: Insufficient documentation

## 2015-12-05 DIAGNOSIS — M25562 Pain in left knee: Principal | ICD-10-CM

## 2015-12-05 NOTE — Assessment & Plan Note (Signed)
combination of patellar tendinitis and patellofemoral syndrome.  Shown home exercises to do daily.  Discussed arch supports.  Icing, meloxicam as needed.  F/u in 6 weeks.  Consider physical therapy, orthotics if not improving.

## 2015-12-05 NOTE — Assessment & Plan Note (Signed)
consistent with rotator cuff impingement and biceps tendinitis.  Symptomatically improved but exam only mildly better than last visit.  He will take meloxicam as needed now, continue home exercises.  Add nitro patches - discussed risks of headache, skin irritation with these.  F/u in 6 weeks.  Consider injection, imaging, PT if not improving.
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
## 2017-12-22 ENCOUNTER — Ambulatory Visit: Payer: BLUE CROSS/BLUE SHIELD | Admitting: Family

## 2017-12-23 ENCOUNTER — Ambulatory Visit: Payer: Self-pay | Admitting: Internal Medicine

## 2017-12-26 ENCOUNTER — Encounter: Payer: Self-pay | Admitting: Internal Medicine

## 2017-12-26 ENCOUNTER — Ambulatory Visit: Payer: Self-pay | Admitting: Internal Medicine

## 2017-12-26 VITALS — BP 126/68 | HR 61 | Temp 98.1°F | Resp 16 | Ht 73.0 in | Wt 223.0 lb

## 2017-12-26 DIAGNOSIS — G35 Multiple sclerosis: Secondary | ICD-10-CM

## 2017-12-26 DIAGNOSIS — J069 Acute upper respiratory infection, unspecified: Secondary | ICD-10-CM

## 2017-12-26 MED ORDER — EPINEPHRINE 0.3 MG/0.3ML IJ SOAJ: 0.3000 mg | INTRAMUSCULAR | 1 refills | Status: DC | PRN

## 2017-12-26 MED ORDER — AZELASTINE HCL 0.1 % NA SOLN: 2.0000 | Freq: Every evening | NASAL | 3 refills | Status: DC | PRN

## 2017-12-26 NOTE — Patient Instructions (Signed)
Please schedule a visit to see your primary provider    Rest, fluids , tylenol  For cough:  Take Mucinex DM twice a day as needed until better or Dayquil  For nasal congestion: Use OTC  Flonase : 2 nasal sprays on each side of the nose in the morning until you feel better Use ASTELIN a prescribed spray : 2 nasal sprays on each side of the nose at night until you feel better     Call if not  Back to normal in 10 days  Call anytime if the symptoms are severe
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
## 2017-12-26 NOTE — Progress Notes (Signed)
Pre visit review using our clinic review tool, if applicable. No additional management support is needed unless otherwise documented below in the visit note. 

## 2018-01-09 ENCOUNTER — Ambulatory Visit: Payer: BLUE CROSS/BLUE SHIELD | Admitting: Family

## 2018-01-16 ENCOUNTER — Other Ambulatory Visit (HOSPITAL_COMMUNITY)
Admission: RE | Admit: 2018-01-16 | Discharge: 2018-01-16 | Disposition: A | Discharge status: Home / Self Care | Payer: BLUE CROSS/BLUE SHIELD | Source: Ambulatory Visit | Attending: Family | Admitting: Family

## 2018-01-16 ENCOUNTER — Ambulatory Visit: Payer: BLUE CROSS/BLUE SHIELD | Admitting: Family

## 2018-01-16 ENCOUNTER — Encounter: Payer: Self-pay | Admitting: Family

## 2018-01-16 VITALS — BP 130/70 | HR 65 | Temp 98.4°F | Resp 16 | Ht 73.0 in | Wt 226.0 lb

## 2018-01-16 DIAGNOSIS — Z113 Encounter for screening for infections with a predominantly sexual mode of transmission: Secondary | ICD-10-CM

## 2018-01-16 DIAGNOSIS — R3915 Urgency of urination: Secondary | ICD-10-CM | POA: Diagnosis not present

## 2018-01-16 DIAGNOSIS — A630 Anogenital (venereal) warts: Secondary | ICD-10-CM | POA: Diagnosis not present

## 2018-01-16 LAB — POC URINALSYSI DIPSTICK (AUTOMATED)
Bilirubin, UA: NEGATIVE
GLUCOSE UA: NEGATIVE
Ketones, UA: NEGATIVE
Leukocytes, UA: NEGATIVE
Nitrite, UA: NEGATIVE
Protein, UA: POSITIVE — AB
Spec Grav, UA: 1.025 (ref 1.010–1.025)
Urobilinogen, UA: 1 E.U./dL
pH, UA: 6.5 (ref 5.0–8.0)

## 2018-01-16 NOTE — Patient Instructions (Signed)
Please complete lab work prior to leaving. Follow up in 1 month.  
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
## 2018-01-17 ENCOUNTER — Telehealth: Payer: Self-pay | Admitting: Family

## 2018-01-17 LAB — PSA: PSA: 0.55 ng/mL (ref 0.10–4.00)

## 2018-01-17 NOTE — Telephone Encounter (Signed)
Please contact patient and let him know that urine shows some protein.  It also shows some trace blood.  I would like for him to return to the lab in 2 weeks for follow-up urinalysis with micro diagnosis proteinuria

## 2018-01-18 LAB — URINE CULTURE
MICRO NUMBER:: 90928448
RESULT: NO GROWTH
SPECIMEN QUALITY:: ADEQUATE

## 2018-01-18 LAB — URINE CYTOLOGY ANCILLARY ONLY
Chlamydia: NEGATIVE
NEISSERIA GONORRHEA: NEGATIVE

## 2018-01-18 LAB — HIV ANTIBODY (ROUTINE TESTING W REFLEX): HIV 1&2 Ab, 4th Generation: NONREACTIVE

## 2018-01-18 NOTE — Telephone Encounter (Signed)
Advised patient of results, he was not able to schedule appointment to provide another urine due to work schedule. He asked if he can do that when he returns for his one month follow up.

## 2018-01-18 NOTE — Telephone Encounter (Signed)
Also please let him know that gonorrhea, chlamydia, and HIV testing are negative.

## 2018-01-18 NOTE — Telephone Encounter (Signed)
Yes

## 2018-02-10 ENCOUNTER — Emergency Department (HOSPITAL_COMMUNITY)
Admission: EM | Admit: 2018-02-10 | Discharge: 2018-02-11 | Disposition: A | Discharge status: Home / Self Care | Payer: BLUE CROSS/BLUE SHIELD | Attending: Emergency Medicine | Admitting: Emergency Medicine

## 2018-02-10 ENCOUNTER — Encounter (HOSPITAL_COMMUNITY): Payer: Self-pay | Admitting: *Deleted

## 2018-02-10 ENCOUNTER — Other Ambulatory Visit: Payer: Self-pay

## 2018-02-10 DIAGNOSIS — Z79899 Other long term (current) drug therapy: Secondary | ICD-10-CM | POA: Insufficient documentation

## 2018-02-10 DIAGNOSIS — R6884 Jaw pain: Secondary | ICD-10-CM | POA: Diagnosis not present

## 2018-02-10 DIAGNOSIS — K119 Disease of salivary gland, unspecified: Secondary | ICD-10-CM

## 2018-02-10 DIAGNOSIS — Z87891 Personal history of nicotine dependence: Secondary | ICD-10-CM | POA: Insufficient documentation

## 2018-02-10 DIAGNOSIS — D110 Benign neoplasm of parotid gland: Secondary | ICD-10-CM | POA: Diagnosis not present

## 2018-02-10 DIAGNOSIS — K118 Other diseases of salivary glands: Secondary | ICD-10-CM | POA: Insufficient documentation

## 2018-02-10 MED ORDER — IBUPROFEN 800 MG PO TABS
800.0000 mg | ORAL_TABLET | Freq: Once | ORAL | Status: AC
  Administered 2018-02-10: 800 mg via ORAL
  Filled 2018-02-10: qty 1

## 2018-02-10 MED ORDER — AMOXICILLIN-POT CLAVULANATE 875-125 MG PO TABS: 1.0000 | ORAL_TABLET | Freq: Two times a day (BID) | ORAL | 0 refills | Status: AC
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
## 2018-02-10 NOTE — ED Triage Notes (Signed)
Pt arrives with 2-3 days of pain in the left jaw that goes up through his ear. He is unsure of fevers. No meds PTA.

## 2018-02-10 NOTE — Discharge Instructions (Addendum)
1. Medications: Augmentin, usual home medications, alternate tylenol and ibuprofen for pain control 2. Treatment: rest, drink plenty of fluids, take medications as prescribed 3. Follow Up: Please followup with dentistry and/or PCP within 2-3 days for discussion of your diagnoses and further evaluation after today's visit; if you do not have a primary care doctor use the resource guide provided to find one; Return to the ER for high fevers, difficulty breathing, difficulty swallowing, worsening swelling or other concerning symptoms

## 2018-05-15 ENCOUNTER — Ambulatory Visit: Payer: BLUE CROSS/BLUE SHIELD | Admitting: Family

## 2018-05-15 ENCOUNTER — Encounter: Payer: Self-pay | Admitting: Family

## 2018-05-15 VITALS — BP 123/74 | HR 87 | Temp 98.8°F | Resp 16 | Ht 73.0 in | Wt 229.0 lb

## 2018-05-15 DIAGNOSIS — M5412 Radiculopathy, cervical region: Secondary | ICD-10-CM

## 2018-05-15 MED ORDER — METHOCARBAMOL 500 MG PO TABS: 500.0000 mg | ORAL_TABLET | Freq: Three times a day (TID) | ORAL | 0 refills | Status: DC | PRN

## 2018-05-15 MED ORDER — METHYLPREDNISOLONE 4 MG PO TBPK: ORAL_TABLET | ORAL | 0 refills | Status: DC

## 2018-05-15 NOTE — Patient Instructions (Signed)
Please begin medrol dose pak and as needed robaxin. Call if symptoms worsen or if they are not improved in 1 week.
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
## 2018-05-22 ENCOUNTER — Encounter: Payer: Self-pay | Admitting: Family

## 2018-05-22 ENCOUNTER — Ambulatory Visit (HOSPITAL_BASED_OUTPATIENT_CLINIC_OR_DEPARTMENT_OTHER)
Admission: RE | Admit: 2018-05-22 | Discharge: 2018-05-22 | Disposition: A | Discharge status: Home / Self Care | Payer: BLUE CROSS/BLUE SHIELD | Source: Ambulatory Visit | Attending: Family | Admitting: Family

## 2018-05-22 ENCOUNTER — Ambulatory Visit: Payer: BLUE CROSS/BLUE SHIELD | Admitting: Family

## 2018-05-22 VITALS — BP 119/72 | HR 81 | Temp 98.4°F | Resp 16 | Ht 73.0 in | Wt 225.0 lb

## 2018-05-22 DIAGNOSIS — M5412 Radiculopathy, cervical region: Secondary | ICD-10-CM

## 2018-05-22 DIAGNOSIS — M4722 Other spondylosis with radiculopathy, cervical region: Secondary | ICD-10-CM | POA: Diagnosis not present

## 2018-05-22 MED ORDER — METHOCARBAMOL 500 MG PO TABS: 500.0000 mg | ORAL_TABLET | Freq: Three times a day (TID) | ORAL | 0 refills | Status: DC | PRN

## 2018-05-22 MED ORDER — MELOXICAM 7.5 MG PO TABS: 7.5000 mg | ORAL_TABLET | Freq: Every day | ORAL | 0 refills | Status: DC

## 2018-05-22 MED ORDER — OXYCODONE-ACETAMINOPHEN 5-325 MG PO TABS: 1.0000 | ORAL_TABLET | Freq: Four times a day (QID) | ORAL | 0 refills | Status: DC | PRN
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
## 2018-05-22 NOTE — Addendum Note (Signed)
Addended by: Sandford Craze on: 05/22/2018 09:11 AM   Modules accepted: Orders

## 2018-05-22 NOTE — Patient Instructions (Signed)
You may use meloxicam once daily (anti-inflammatory) for pain. You may use robaxin as needed for muscle spasm. You may use percocet as needed for severe pain- do not drive after taking. Please complete x ray on the first floor. We will work on getting your MRI approved by your insurance.  Call if new/worsening symptoms or if symptoms are not improved in 2 weeks.

## 2018-06-19 ENCOUNTER — Ambulatory Visit: Payer: BLUE CROSS/BLUE SHIELD | Admitting: Family

## 2018-06-19 DIAGNOSIS — Z0289 Encounter for other administrative examinations: Secondary | ICD-10-CM

## 2018-08-18 ENCOUNTER — Encounter: Payer: Self-pay | Admitting: Family Medicine

## 2018-08-18 ENCOUNTER — Ambulatory Visit: Payer: BLUE CROSS/BLUE SHIELD | Admitting: Family Medicine

## 2018-08-18 ENCOUNTER — Ambulatory Visit: Payer: BLUE CROSS/BLUE SHIELD | Admitting: Family

## 2018-08-18 VITALS — BP 108/76 | HR 90 | Temp 98.5°F | Ht 73.0 in | Wt 228.1 lb

## 2018-08-18 DIAGNOSIS — L729 Follicular cyst of the skin and subcutaneous tissue, unspecified: Secondary | ICD-10-CM | POA: Diagnosis not present

## 2018-08-18 NOTE — Patient Instructions (Addendum)
If you do not hear anything about your referral in the next 1-2 weeks, call our office and ask for an update.  You do not need to do anything to treat this at home.   I would not put anything inside of it (like a needle) in the future.  Let us know if you need anything.
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
## 2018-08-25 DIAGNOSIS — L723 Sebaceous cyst: Secondary | ICD-10-CM | POA: Diagnosis not present

## 2018-09-04 ENCOUNTER — Ambulatory Visit: Payer: BLUE CROSS/BLUE SHIELD | Admitting: Family Medicine

## 2018-09-04 ENCOUNTER — Other Ambulatory Visit: Payer: Self-pay

## 2018-09-04 ENCOUNTER — Encounter: Payer: Self-pay | Admitting: Family Medicine

## 2018-09-04 VITALS — BP 108/78 | HR 79 | Temp 98.2°F | Ht 73.0 in | Wt 229.0 lb

## 2018-09-04 DIAGNOSIS — M7061 Trochanteric bursitis, right hip: Secondary | ICD-10-CM

## 2018-09-04 MED ORDER — METHYLPREDNISOLONE ACETATE 40 MG/ML IJ SUSP
40.0000 mg | Freq: Once | INTRAMUSCULAR | Status: AC
  Administered 2018-09-04: 40 mg via INTRA_ARTICULAR
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
## 2018-09-04 NOTE — Patient Instructions (Signed)
Keep up the stretches and icing.  Iliotibial Band Syndrome Rehab It is normal to feel mild stretching, pulling, tightness, or discomfort as you do these exercises, but you should stop right away if you feel sudden pain or your pain gets worse.  Stretching and range of motion exercises These exercises warm up your muscles and joints and improve the movement and flexibility of your hip and pelvis. Exercise A: Quadriceps, prone    1. Lie on your abdomen on a firm surface, such as a bed or padded floor. 2. Bend your left / right knee and hold your ankle. If you cannot reach your ankle or pant leg, loop a belt around your foot and grab the belt instead. 3. Gently pull your heel toward your buttocks. Your knee should not slide out to the side. You should feel a stretch in the front of your thigh and knee. 4. Hold this position for 30 seconds. Repeat 2 times. Complete this stretch 3 times per week. Exercise B: Iliotibial band    1. Lie on your side with your left / right leg in the top position. 2. Bend both of your knees and grab your left / right ankle. Stretch out your bottom arm to help you balance. 3. Slowly bring your top knee back so your thigh goes behind your trunk. 4. Slowly lower your top leg toward the floor until you feel a gentle stretch on the outside of your left / right hip and thigh. If you do not feel a stretch and your knee will not fall farther, place the heel of your other foot on top of your knee and pull your knee down toward the floor with your foot. 5. Hold this position for 30 seconds. Repeat 2 times. Complete this stretch 3 times per week. Strengthening exercises These exercises build strength and endurance in your hip and pelvis. Endurance is the ability to use your muscles for a long time, even after they get tired. Exercise C: Straight leg raises (hip abductors)     1. Lie on your side with your left / right leg in the top position. Lie so your head, shoulder,  knee, and hip line up. You may bend your bottom knee to help you balance. 2. Roll your hips slightly forward so your hips are stacked directly over each other and your left / right knee is facing forward. 3. Tense the muscles in your outer thigh and lift your top leg 4-6 inches (10-15 cm). 4. Hold this position for 3 seconds. Repeat for a total of 10 reps. 5. Slowly return to the starting position. Let your muscles relax completely before doing another repetition. Repeat 2 times. Complete this exercise 3 times per week. Exercise D: Straight leg raises (hip extensors) 1. Lie on your abdomen on your bed or a firm surface. You can put a pillow under your hips if that is more comfortable. 2. Bend your left / right knee so your foot is straight up in the air. 3. Squeeze your buttock muscles and lift your left / right thigh off the bed. Do not let your back arch. 4. Tense this muscle as hard as you can without increasing any knee pain. 5. Hold this position for 2 seconds. Repeat for a total of 10 reps 6. Slowly lower your leg to the starting position and allow it to relax completely. Repeat 2 times. Complete this exercise 3 times per week. Exercise E: Hip hike 1. Stand sideways on a bottom step. Stand  on your left / right leg with your other foot unsupported next to the step. You can hold onto the railing or wall if needed for balance. 2. Keep your knees straight and your torso square. Then, lift your left / right hip up toward the ceiling. 3. Slowly let your left / right hip lower toward the floor, past the starting position. Your foot should get closer to the floor. Do not lean or bend your knees. Repeat 2 times. Complete this exercise 3 times per week.  Document Released: 05/31/2005 Document Revised: 02/03/2016 Document Reviewed: 05/02/2015 Elsevier Interactive Patient Education  [employer].

## 2019-05-14 ENCOUNTER — Other Ambulatory Visit: Payer: Self-pay

## 2019-05-14 ENCOUNTER — Encounter: Payer: Self-pay | Admitting: Family Medicine

## 2019-05-14 ENCOUNTER — Ambulatory Visit: Payer: BC Managed Care – PPO | Admitting: Family Medicine

## 2019-05-14 VITALS — BP 118/80 | HR 68 | Temp 97.0°F | Ht 73.0 in | Wt 225.0 lb

## 2019-05-14 DIAGNOSIS — M7712 Lateral epicondylitis, left elbow: Secondary | ICD-10-CM

## 2019-05-14 MED ORDER — PREDNISONE 20 MG PO TABS
40.0000 mg | ORAL_TABLET | Freq: Every day | ORAL | 0 refills | Status: AC
Start: 2019-05-14 — End: 2019-05-19

## 2019-05-14 NOTE — Patient Instructions (Addendum)
Wear the splint at work and when you sleep.  Ice/cold pack over area for 10-15 min twice daily.  Heat (pad or rice pillow in microwave) over affected area (forearm muscles), 10-15 minutes twice daily.    OK to take Tylenol 1000 mg (2 extra strength tabs) or 975 mg (3 regular strength tabs) every 6 hours as needed.  Return to clinic in 1 week if no improvements.   Elbow and Forearm Exercises It is normal to feel mild stretching, pulling, tightness, or discomfort as you do these exercises, but you should stop right away if you feel sudden pain or your pain gets worse. RANGE OF MOTION EXERCISES These exercises warm up your muscles and joints and improve the movement and flexibility of your injured elbow and forearm. These exercises also help to relieve pain, numbness, and tingling.These exercises are done using the muscles in your injured elbow and forearm. Exercise A: Elbow Flexion, Active 1. Hold your left / right arm at your side, and bend your elbow as far as you can using your left / right arm muscles. 2. Hold this position for 30 seconds. 3. Slowly return to the starting position. Repeat 2 times. Complete this exercise 3 times per week. Exercise B: Elbow Extension, Active 1. Hold your left / right arm at your side, and straighten your elbow as much as you can using your left / right arm muscles. 2. Hold this position for 30 seconds. 3. Slowly return to the starting position. Repeat 2 times. Complete this exercise 3 times per week. Exercise C: Forearm Rotation, Supination, Active 1. Stand or sit with your elbows at your sides. 2. Bend your left / right elbow to an "L" shape (90 degrees). 3. Turn your palm upward until you feel a gentle stretch on the inside of your forearm. 4. Hold this position for 30 seconds. 5. Slowly release and return to the starting position. Repeat 2 times. Complete this exercise 3 times per week. Exercise D: Forearm Rotation, Pronation, Active 1. Stand or  sit with your elbows at your side. 2. Bend your left / right elbow to an "L" shape (90 degrees). 3. Turn your left / right palm downward until you feel a gentle stretch on the top of your forearm. 4. Hold this position for 30 seconds. 5. Slowly release and return to the starting position. Repeat2 times. Complete this exercise 3 times per week. STRETCHING EXERCISES These exercises warm up your muscles and joints and improve the movement and flexibility of your injured elbow and forearm. These exercises also help to relieve pain, numbness, and tingling.These exercises are done using your healthy elbow and forearm to help stretch the muscles in your injured elbow and forearm. Exercise E: Elbow Flexion, Active-Assisted  1. Hold your left / right arm at your side, and bend your elbow as much as you can using your left / right arm muscles. 2. Use your other hand to bend your left / right elbow farther. To do this, gently push up on your forearm until you feel a gentle stretch on the back of your elbow. 3. Hold this position for 30 seconds. 4. Slowly return to the starting position. Repeat 2 times. Complete this exercise 3 times per week. Exercise F: Elbow Extension, Active-Assisted  1. Hold your left / right arm at your side, and straighten your elbow as much as you can using your left / right arm muscles. 2. Use your other hand to straighten the left / right elbow farther. To  do this, gently push down on your forearm until you feel a gentle stretch on the inside of your elbow. 3. Hold this position for 30 seconds. 4. Slowly return to the starting position. Repeat 2 times. Complete this exercise 3 times per weeky. Exercise G: Forearm Rotation, Supination, Active-Assisted  1. Sit with your left / right elbow bent in an "L" shape (90 degrees) with your forearm resting on a table. 2. Keeping your upper body and shoulder still, rotate your forearm so your left / right palm faces upward. 3. Use your  other hand to help rotate your forearm further until you feel a gentle to moderate stretch. 4. Hold this position for 30 seconds. 5. Slowly release the stretch and return to the starting position. Repeat 2 times. Complete this exercise 3 times per week. Exercise H: Forearm Rotation, Pronation, Active-Assisted  1. Sit with your left / right elbow bent in an "L" shape (90 degrees) with your forearm resting on a table. 2. Keeping your upper body and shoulder still, rotate your forearm so your palm faces the tabletop. 3. Use your other hand to help rotate your forearm further until you feel a gentle to moderate stretch. 4. Hold this position for 30 seconds. 5. Slowly release the stretch and return to the starting position. Repeat 2 times. Complete this exercise 3 times per week. Exercise I: Elbow Flexion, Supine, Passive 1. Lie on your back. 2. Extend your left / right arm up in the air, bracing it with your other hand. 3. Let your left / right your hand slowly lower toward your shoulder, while your elbow stays pointed toward the ceiling. You should feel a gentle stretch along the back of your upper arm and elbow. 4. If instructed by your health care provider, you may increase the intensity of your stretch by adding a small wrist weight or hand weight. 5. Hold this position for 3 seconds. 6. Slowly return to the starting position. Repeat 2 times. Complete this exercise 3 times per week. Exercise J: Elbow Extension, Supine, Passive  1. Lie on your back. Make sure that you are in a comfortable position that lets you relax your arm muscles. 2. Place a folded towel under your left / right upper arm so your elbow and shoulder are at the same height. Straighten your left / right arm so your elbow does not rest on the bed or towel. 3. Let the weight of your hand stretch your elbow. Keep your arm and chest muscles relaxed. You should feel a stretch on the inside of your elbow. 4. If told by your health  care provider, you may increase the intensity of your stretch by adding a small wrist weight or hand weight. 5. Hold this position for 30 seconds. 6. Slowly release the stretch. Repeat 2 times. Complete this exercise 3 times per week. STRENGTHENING EXERCISES These exercises build strength and endurance in your elbow and forearm. Endurance is the ability to use your muscles for a long time, even after they get tired. Exercise K: Elbow Flexion, Isometric  1. Stand or sit up straight. 2. Bend your left / right elbow in an "L" shape (90 degrees) and turn your palm up so your forearm is at the height of your waist. 3. Place your other hand on top of your forearm. Gently push down as your left / right arm resists. Push as hard as you can with both arms without causing any pain or movement at your left / right elbow.  4. Hold this position for 3 seconds. 5. Slowly release the tension in both arms. Let your muscles relax completely before repeating. Repeat 2 times. Complete this exercise 3 times per week. Exercise L: Elbow Extensors, Isometric  1. Stand or sit up straight. 2. Place your left / right arm so your palm faces your abdomen and it is at the height of your waist. 3. Place your other hand on the underside of your forearm. Gently push up as your left / right arm resists. Push as hard as you can with both arms, without causing any pain or movement at your left / right elbow. 4. Hold this position for 3 seconds. 5. Slowly release the tension in both arms. Let your muscles relax completely before repeating. Repeat _______2___ times. Complete this exercise 3 times per week. Exercise M: Elbow Flexion With Forearm Palm Up  1. Sit upright on a firm chair without armrests, or stand. 2. Place your left / right arm at your side with your palm facing forward. 3. Holding a 5 lbweight or gripping a rubber exercise band or tubing, bend your elbow to bring your hand toward your shoulder. 4. Hold this  position for 3 seconds. 5. Slowly return to the starting position. Repeat 2 times. Complete this exercise 3 times per week. Exercise N: Elbow Extension  1. Sit on a firm chair without armrests, or stand. 2. Keeping your upper arms at your sides, bring both hands up toward your left / right shoulder while you grip a rubber exercise band or tubing. Your left / right hand should be just below the other hand. 3. Straighten your left / right elbow. 4. Hold this position for 3 seconds. 5. Control the resistance of the band or tubing as your hand returns to your side. Repeat 2 times. Complete this exercise 3 times per week. Exercise O: Forearm Rotation, Supination  1. Sit with your left / right forearm supported on a table. Keep your elbow at waist height. 2. Rest your hand over the edge of the table with your palm facing down. 3. Gently hold a lightweight hammer. 4. Without moving your elbow, slowly rotate your forearm to turn your palm and hand upward to a "thumbs-up" position. 5. Hold this position for 3 seconds. 6. Slowly return to the starting position. Repeat 2 times. Complete this exercise 3 times per week. Exercise P: Forearm Rotation, Pronation  1. Sit with your left / right forearm supported on a table. Keep your elbow below shoulder height. 2. Rest your hand over the edge of the table with your palm facing up. 3. Gently hold a lightweight hammer. 4. Without moving your elbow, slowly rotate your forearm to turn your palm and hand upward to a "thumbs-up" position. 5. Hold this position for 3 seconds. 6. Slowly return to the starting position. Repeat 2 times. Complete this exercise 3 times per week.  Make sure you discuss any questions you have with your health care provider. Document Released: 04/14/2005 Document Revised: 10/09/2015 Document Reviewed: 02/23/2015 Elsevier Interactive Patient Education  [employer].
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
## 2019-05-18 ENCOUNTER — Other Ambulatory Visit: Payer: Self-pay

## 2019-05-21 ENCOUNTER — Ambulatory Visit: Payer: BC Managed Care – PPO | Admitting: Family Medicine

## 2019-05-21 ENCOUNTER — Encounter: Payer: Self-pay | Admitting: Family Medicine

## 2019-05-21 ENCOUNTER — Other Ambulatory Visit: Payer: Self-pay

## 2019-05-21 VITALS — BP 112/72 | HR 66 | Temp 98.0°F | Ht 73.0 in | Wt 232.4 lb

## 2019-05-21 DIAGNOSIS — M7712 Lateral epicondylitis, left elbow: Secondary | ICD-10-CM | POA: Diagnosis not present

## 2019-05-21 MED ORDER — METHYLPREDNISOLONE ACETATE 40 MG/ML IJ SUSP
20.0000 mg | Freq: Once | INTRAMUSCULAR | Status: AC
  Administered 2019-05-21: 20 mg via INTRA_ARTICULAR
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
## 2019-05-21 NOTE — Addendum Note (Signed)
Addended by: Sharon Seller B on: 05/21/2019 09:26 AM   Modules accepted: Orders

## 2019-05-21 NOTE — Patient Instructions (Addendum)
Continue icing and over-the-counter medications.  Continue the stretches and exercises.  The shot can take up to 7 days to fully be effective.   If you do not hear anything about your referral in the next 1-2 weeks, call our office and ask for an update.  Let us know if you need anything.

## 2020-04-21 ENCOUNTER — Inpatient Hospital Stay (HOSPITAL_COMMUNITY)
Admission: EM | Admit: 2020-04-21 | Discharge: 2020-04-24 | DRG: 177 | Disposition: A | Discharge status: Home / Self Care | Payer: HRSA Program | Attending: Family Medicine | Admitting: Family Medicine

## 2020-04-21 ENCOUNTER — Encounter (HOSPITAL_COMMUNITY): Payer: Self-pay

## 2020-04-21 ENCOUNTER — Other Ambulatory Visit: Payer: Self-pay

## 2020-04-21 DIAGNOSIS — E669 Obesity, unspecified: Secondary | ICD-10-CM | POA: Diagnosis present

## 2020-04-21 DIAGNOSIS — J9601 Acute respiratory failure with hypoxia: Secondary | ICD-10-CM | POA: Diagnosis not present

## 2020-04-21 DIAGNOSIS — Z87891 Personal history of nicotine dependence: Secondary | ICD-10-CM

## 2020-04-21 DIAGNOSIS — K219 Gastro-esophageal reflux disease without esophagitis: Secondary | ICD-10-CM | POA: Diagnosis present

## 2020-04-21 DIAGNOSIS — N179 Acute kidney failure, unspecified: Secondary | ICD-10-CM | POA: Diagnosis present

## 2020-04-21 DIAGNOSIS — Z8249 Family history of ischemic heart disease and other diseases of the circulatory system: Secondary | ICD-10-CM

## 2020-04-21 DIAGNOSIS — J159 Unspecified bacterial pneumonia: Secondary | ICD-10-CM | POA: Diagnosis present

## 2020-04-21 DIAGNOSIS — J1282 Pneumonia due to coronavirus disease 2019: Secondary | ICD-10-CM | POA: Diagnosis present

## 2020-04-21 DIAGNOSIS — Z833 Family history of diabetes mellitus: Secondary | ICD-10-CM

## 2020-04-21 DIAGNOSIS — Z6830 Body mass index (BMI) 30.0-30.9, adult: Secondary | ICD-10-CM

## 2020-04-21 DIAGNOSIS — U071 COVID-19: Principal | ICD-10-CM | POA: Diagnosis present

## 2020-04-21 DIAGNOSIS — Z885 Allergy status to narcotic agent status: Secondary | ICD-10-CM

## 2020-04-21 DIAGNOSIS — G35 Multiple sclerosis: Secondary | ICD-10-CM | POA: Diagnosis present

## 2020-04-21 NOTE — ED Triage Notes (Signed)
Pt arrives EMS with c/o covid + 2 days ago and shortness of breath. EMS reports finding pt at 84% on room air. Applying 4L [place] and improvement to 98%. Pt has cough, fever, n/v/d, lack of appetite and hx of MS.   EMS has 18G [place] and administered NS, 4mg  zofran, 1000mg  tylenol pta for fever.

## 2020-04-22 ENCOUNTER — Encounter (HOSPITAL_COMMUNITY): Payer: Self-pay | Admitting: Internal Medicine

## 2020-04-22 ENCOUNTER — Emergency Department (HOSPITAL_COMMUNITY): Payer: HRSA Program

## 2020-04-22 DIAGNOSIS — K219 Gastro-esophageal reflux disease without esophagitis: Secondary | ICD-10-CM

## 2020-04-22 DIAGNOSIS — G35 Multiple sclerosis: Secondary | ICD-10-CM

## 2020-04-22 DIAGNOSIS — U071 COVID-19: Principal | ICD-10-CM

## 2020-04-22 DIAGNOSIS — N179 Acute kidney failure, unspecified: Secondary | ICD-10-CM

## 2020-04-22 DIAGNOSIS — J1282 Pneumonia due to coronavirus disease 2019: Secondary | ICD-10-CM | POA: Diagnosis not present

## 2020-04-22 LAB — URINALYSIS, ROUTINE W REFLEX MICROSCOPIC
Bilirubin Urine: NEGATIVE
Glucose, UA: NEGATIVE mg/dL
Hgb urine dipstick: NEGATIVE
Ketones, ur: 5 mg/dL — AB
Leukocytes,Ua: NEGATIVE
Nitrite: NEGATIVE
Protein, ur: 100 mg/dL — AB
Specific Gravity, Urine: 1.018 (ref 1.005–1.030)
pH: 5 (ref 5.0–8.0)

## 2020-04-22 LAB — STREP PNEUMONIAE URINARY ANTIGEN: Strep Pneumo Urinary Antigen: NEGATIVE

## 2020-04-22 LAB — CBC WITH DIFFERENTIAL/PLATELET
Abs Immature Granulocytes: 0.02 10*3/uL (ref 0.00–0.07)
Basophils Absolute: 0 10*3/uL (ref 0.0–0.1)
Basophils Relative: 0 %
Eosinophils Absolute: 0 10*3/uL (ref 0.0–0.5)
Eosinophils Relative: 0 %
HCT: 44.9 % (ref 39.0–52.0)
Hemoglobin: 15.9 g/dL (ref 13.0–17.0)
Immature Granulocytes: 0 %
Lymphocytes Relative: 10 %
Lymphs Abs: 0.5 10*3/uL — ABNORMAL LOW (ref 0.7–4.0)
MCH: 31.2 pg (ref 26.0–34.0)
MCHC: 35.4 g/dL (ref 30.0–36.0)
MCV: 88 fL (ref 80.0–100.0)
Monocytes Absolute: 0.3 10*3/uL (ref 0.1–1.0)
Monocytes Relative: 6 %
Neutro Abs: 4.1 10*3/uL (ref 1.7–7.7)
Neutrophils Relative %: 84 %
Platelets: 163 10*3/uL (ref 150–400)
RBC: 5.1 MIL/uL (ref 4.22–5.81)
RDW: 11.9 % (ref 11.5–15.5)
WBC: 4.9 10*3/uL (ref 4.0–10.5)
nRBC: 0 % (ref 0.0–0.2)

## 2020-04-22 LAB — COMPREHENSIVE METABOLIC PANEL
ALT: 60 U/L — ABNORMAL HIGH (ref 0–44)
AST: 101 U/L — ABNORMAL HIGH (ref 15–41)
Albumin: 3.6 g/dL (ref 3.5–5.0)
Alkaline Phosphatase: 67 U/L (ref 38–126)
Anion gap: 12 (ref 5–15)
BUN: 24 mg/dL — ABNORMAL HIGH (ref 6–20)
CO2: 21 mmol/L — ABNORMAL LOW (ref 22–32)
Calcium: 8.3 mg/dL — ABNORMAL LOW (ref 8.9–10.3)
Chloride: 103 mmol/L (ref 98–111)
Creatinine, Ser: 2.44 mg/dL — ABNORMAL HIGH (ref 0.61–1.24)
GFR, Estimated: 34 mL/min — ABNORMAL LOW (ref >60)
Glucose, Bld: 144 mg/dL — ABNORMAL HIGH (ref 70–99)
Potassium: 3.8 mmol/L (ref 3.5–5.1)
Sodium: 136 mmol/L (ref 135–145)
Total Bilirubin: 0.8 mg/dL (ref 0.3–1.2)
Total Protein: 7.1 g/dL (ref 6.5–8.1)

## 2020-04-22 LAB — RESPIRATORY PANEL BY RT PCR (FLU A&B, COVID)
Influenza A by PCR: NEGATIVE
Influenza B by PCR: NEGATIVE
SARS Coronavirus 2 by RT PCR: POSITIVE — AB

## 2020-04-22 LAB — D-DIMER, QUANTITATIVE: D-Dimer, Quant: 2.27 ug/mL-FEU — ABNORMAL HIGH (ref 0.00–0.50)

## 2020-04-22 LAB — LACTIC ACID, PLASMA
Lactic Acid, Venous: 1.2 mmol/L (ref 0.5–1.9)
Lactic Acid, Venous: 1.1 mmol/L (ref 0.5–1.9)

## 2020-04-22 LAB — TROPONIN I (HIGH SENSITIVITY)
Troponin I (High Sensitivity): 3 ng/L (ref <18)
Troponin I (High Sensitivity): <2 ng/L (ref <18)

## 2020-04-22 LAB — PROCALCITONIN: Procalcitonin: 0.64 ng/mL

## 2020-04-22 LAB — LACTATE DEHYDROGENASE: LDH: 767 U/L — ABNORMAL HIGH (ref 98–192)

## 2020-04-22 LAB — FIBRINOGEN: Fibrinogen: 645 mg/dL — ABNORMAL HIGH (ref 210–475)

## 2020-04-22 LAB — TRIGLYCERIDES: Triglycerides: 182 mg/dL — ABNORMAL HIGH (ref <150)

## 2020-04-22 LAB — MAGNESIUM: Magnesium: 2 mg/dL (ref 1.7–2.4)

## 2020-04-22 LAB — PHOSPHORUS: Phosphorus: 3.4 mg/dL (ref 2.5–4.6)

## 2020-04-22 LAB — FERRITIN: Ferritin: 2918 ng/mL — ABNORMAL HIGH (ref 24–336)

## 2020-04-22 LAB — HIV ANTIBODY (ROUTINE TESTING W REFLEX): HIV Screen 4th Generation wRfx: NONREACTIVE

## 2020-04-22 LAB — C-REACTIVE PROTEIN: CRP: 19.7 mg/dL — ABNORMAL HIGH (ref <1.0)

## 2020-04-22 MED ORDER — SODIUM CHLORIDE 0.9 % IV SOLN
2.0000 g | INTRAVENOUS | Status: DC
  Administered 2020-04-22 – 2020-04-24 (×3): 2 g via INTRAVENOUS
  Filled 2020-04-22 (×2): qty 20
  Filled 2020-04-22: qty 2

## 2020-04-22 MED ORDER — SODIUM CHLORIDE 0.9 % IV SOLN
500.0000 mg | INTRAVENOUS | Status: DC
  Administered 2020-04-22 – 2020-04-24 (×3): 500 mg via INTRAVENOUS
  Filled 2020-04-22 (×3): qty 500

## 2020-04-22 MED ORDER — DEXAMETHASONE SODIUM PHOSPHATE 10 MG/ML IJ SOLN
6.0000 mg | Freq: Once | INTRAMUSCULAR | Status: AC
  Administered 2020-04-22: 6 mg via INTRAVENOUS
  Filled 2020-04-22: qty 1

## 2020-04-22 MED ORDER — ALBUTEROL SULFATE HFA 108 (90 BASE) MCG/ACT IN AERS
2.0000 | INHALATION_SPRAY | Freq: Four times a day (QID) | RESPIRATORY_TRACT | Status: DC
  Administered 2020-04-22 – 2020-04-24 (×8): 2 via RESPIRATORY_TRACT
  Filled 2020-04-22: qty 6.7

## 2020-04-22 MED ORDER — SODIUM CHLORIDE 0.9 % IV BOLUS
500.0000 mL | Freq: Once | INTRAVENOUS | Status: AC
  Administered 2020-04-22: 500 mL via INTRAVENOUS

## 2020-04-22 MED ORDER — FENTANYL CITRATE (PF) 100 MCG/2ML IJ SOLN
50.0000 ug | Freq: Once | INTRAMUSCULAR | Status: AC
  Administered 2020-04-22: 50 ug via INTRAVENOUS
  Filled 2020-04-22: qty 2

## 2020-04-22 MED ORDER — ZINC SULFATE 220 (50 ZN) MG PO CAPS
220.0000 mg | ORAL_CAPSULE | Freq: Every day | ORAL | Status: DC
  Administered 2020-04-22 – 2020-04-24 (×3): 220 mg via ORAL
  Filled 2020-04-22 (×3): qty 1

## 2020-04-22 MED ORDER — ASCORBIC ACID 500 MG PO TABS
500.0000 mg | ORAL_TABLET | Freq: Every day | ORAL | Status: DC
  Administered 2020-04-22 – 2020-04-24 (×3): 500 mg via ORAL
  Filled 2020-04-22 (×3): qty 1

## 2020-04-22 MED ORDER — FAMOTIDINE 20 MG PO TABS
20.0000 mg | ORAL_TABLET | Freq: Two times a day (BID) | ORAL | Status: DC
  Administered 2020-04-22 – 2020-04-24 (×5): 20 mg via ORAL
  Filled 2020-04-22 (×5): qty 1

## 2020-04-22 MED ORDER — ALBUTEROL SULFATE HFA 108 (90 BASE) MCG/ACT IN AERS
2.0000 | INHALATION_SPRAY | Freq: Once | RESPIRATORY_TRACT | Status: AC
  Administered 2020-04-22: 2 via RESPIRATORY_TRACT
  Filled 2020-04-22: qty 6.7

## 2020-04-22 MED ORDER — ACETAMINOPHEN 325 MG PO TABS
650.0000 mg | ORAL_TABLET | Freq: Four times a day (QID) | ORAL | Status: DC | PRN
  Administered 2020-04-22 – 2020-04-24 (×3): 650 mg via ORAL
  Filled 2020-04-22 (×3): qty 2

## 2020-04-22 MED ORDER — ENOXAPARIN SODIUM 40 MG/0.4ML ~~LOC~~ SOLN
40.0000 mg | SUBCUTANEOUS | Status: DC
  Administered 2020-04-22 – 2020-04-24 (×3): 40 mg via SUBCUTANEOUS
  Filled 2020-04-22 (×3): qty 0.4

## 2020-04-22 MED ORDER — ACETAMINOPHEN 650 MG RE SUPP: 650.0000 mg | Freq: Four times a day (QID) | RECTAL | Status: DC | PRN

## 2020-04-22 MED ORDER — AEROCHAMBER Z-STAT PLUS/MEDIUM MISC
1.0000 | Freq: Once | Status: DC
  Filled 2020-04-22: qty 1

## 2020-04-22 MED ORDER — DEXAMETHASONE SODIUM PHOSPHATE 10 MG/ML IJ SOLN
6.0000 mg | INTRAMUSCULAR | Status: DC
  Administered 2020-04-23 (×2): 6 mg via INTRAVENOUS
  Filled 2020-04-22 (×2): qty 1

## 2020-04-22 MED ORDER — GUAIFENESIN-DM 100-10 MG/5ML PO SYRP
10.0000 mL | ORAL_SOLUTION | ORAL | Status: DC | PRN
  Administered 2020-04-22 – 2020-04-23 (×2): 10 mL via ORAL
  Filled 2020-04-22 (×2): qty 10

## 2020-04-22 MED ORDER — SODIUM CHLORIDE 0.9 % IV SOLN
100.0000 mg | Freq: Every day | INTRAVENOUS | Status: DC
  Administered 2020-04-23 – 2020-04-24 (×2): 100 mg via INTRAVENOUS
  Filled 2020-04-22 (×2): qty 20

## 2020-04-22 MED ORDER — KETOROLAC TROMETHAMINE 30 MG/ML IJ SOLN
30.0000 mg | Freq: Once | INTRAMUSCULAR | Status: AC
  Administered 2020-04-23: 30 mg via INTRAVENOUS
  Filled 2020-04-22: qty 1

## 2020-04-22 MED ORDER — SODIUM CHLORIDE 0.9 % IV SOLN: INTRAVENOUS | Status: AC

## 2020-04-22 MED ORDER — TRAZODONE HCL 50 MG PO TABS
50.0000 mg | ORAL_TABLET | Freq: Every evening | ORAL | Status: DC | PRN
  Administered 2020-04-23 (×2): 50 mg via ORAL
  Filled 2020-04-22 (×2): qty 1

## 2020-04-22 MED ORDER — SODIUM CHLORIDE 0.9 % IV SOLN
200.0000 mg | Freq: Once | INTRAVENOUS | Status: AC
  Administered 2020-04-22: 200 mg via INTRAVENOUS
  Filled 2020-04-22: qty 200

## 2020-04-22 NOTE — ED Notes (Signed)
Pt provided with ice at this time. Pt has no other complaints at this time. Pt updated on plan of care. Pt eating at this time.

## 2020-04-22 NOTE — ED Notes (Addendum)
Pt called out requesting to speak to nurse. Upon going into pt room, pt states " I dont mean any offense but I can be at home doing the same thing. I dont feel like yall are doing anything for me. I dont have an IV going. I am not getting any pain medication. Like what are yall doing?" Pt informed that this RN is unaware that any pain medication has been requested. Pt states " I asked them last night" Pt informed that this RN arrived at 0700. Pt was updated on plan of care, reason for holding in the ED and MD rounding expectation. Pt also informed that he is currently receiving IV fluids. This RN informed pt that we could not force him to stay, this RN then explained why the pt was admitted was due to poor kidney function and oxygen requirement. Pt pulls off oxygen periodically. Pt informed that his O2 sat drops when the O2 is removed. Pt informed on what to expect in ED and expectation on transfer upstairs to inpatient room. Pt verbalizes understanding. Pt offered tylenol and cough medication per MD order. Medication administered. Pt provided with water pitcher, apple juice, ice,and cups. Pt has no other complaints at this time.

## 2020-04-22 NOTE — ED Notes (Signed)
Hospitalist at bedside 

## 2020-04-22 NOTE — ED Notes (Signed)
Pt called out requesting nurse. Upon going into pt room. Pt states "I have called three times in the last 10 minutes." Pt informed that this RN has been in a critical pts room and has been unable to come into his room. Pt states "I just want my tray out of the way"

## 2020-04-22 NOTE — ED Notes (Signed)
Pt stated he could not urinate at this time, Grayling Congress, NT

## 2020-04-22 NOTE — Progress Notes (Signed)
Pt request medication for chest pain 6/10 when taking deep breaths and medication to help him sleep. Marikay Alar NP notified and tramadol and toradol ordered. Informed Shawna Orleans that pt has order to not administer NSAIDS. Shawna Orleans states she spoke with pharmacy and that is ok to administer the toradol.
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
## 2020-04-22 NOTE — ED Notes (Signed)
Breakfast tray delivered

## 2020-04-22 NOTE — Progress Notes (Addendum)
Same day note  Patient seen and examined at bedside.  Admitted early this morning.  Patient was admitted to the hospital for shortness of breath, fever, nausea, vomiting diarrhea  At the time of my evaluation, patient complains of shortness of breath dyspnea.  Denies chest pain, palpitation dizziness or lightheadedness  Physical examination reveals moderately built gentleman on nasal cannula oxygen.  No obvious wheezes noted.  Breath sounds heard bilaterally  Laboratory data and imaging was reviewed  Assessment and Plan.  Acute hypoxic respiratory failure secondary to  COVID-19 pneumonia..  Patient was febrile with a temperature of 101.7 F with tachypnea at 29/min.  Continue supportive care, dexamethasone.  On Rocephin and Zithromax due to elevated procalcitonin.  Follow inflammatory markers.  Continue incentive spirometry, and flutter valve.  Continue bronchodilators.  Strep urinary antigen negative.  Lactate was 1.1.  Acute kidney injury.  Check urinalysis shows no evidence of proteinuria or infection..  Intake and output charting.  Limit IV fluids due to pneumonia.Marland Kitchen  History of multiple sclerosis in remission.  GERD continue famotidine.  I tried to reach the patient's significant other listed in the computer Ms. Morrie Sheldon but was unable to reach her.  No Charge  Signed,  Tenny Craw, MD [facility]
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
## 2020-04-22 NOTE — ED Notes (Signed)
Brought patient a pitcher of water

## 2020-04-23 DIAGNOSIS — J9601 Acute respiratory failure with hypoxia: Secondary | ICD-10-CM | POA: Diagnosis present

## 2020-04-23 DIAGNOSIS — U071 COVID-19: Secondary | ICD-10-CM | POA: Diagnosis present

## 2020-04-23 DIAGNOSIS — Z8249 Family history of ischemic heart disease and other diseases of the circulatory system: Secondary | ICD-10-CM | POA: Diagnosis not present

## 2020-04-23 DIAGNOSIS — K219 Gastro-esophageal reflux disease without esophagitis: Secondary | ICD-10-CM | POA: Diagnosis present

## 2020-04-23 DIAGNOSIS — Z833 Family history of diabetes mellitus: Secondary | ICD-10-CM | POA: Diagnosis not present

## 2020-04-23 DIAGNOSIS — J159 Unspecified bacterial pneumonia: Secondary | ICD-10-CM | POA: Diagnosis present

## 2020-04-23 DIAGNOSIS — Z885 Allergy status to narcotic agent status: Secondary | ICD-10-CM | POA: Diagnosis not present

## 2020-04-23 DIAGNOSIS — N179 Acute kidney failure, unspecified: Secondary | ICD-10-CM | POA: Diagnosis present

## 2020-04-23 DIAGNOSIS — E669 Obesity, unspecified: Secondary | ICD-10-CM | POA: Diagnosis present

## 2020-04-23 DIAGNOSIS — J1282 Pneumonia due to coronavirus disease 2019: Secondary | ICD-10-CM | POA: Diagnosis present

## 2020-04-23 DIAGNOSIS — G35 Multiple sclerosis: Secondary | ICD-10-CM | POA: Diagnosis present

## 2020-04-23 DIAGNOSIS — Z6830 Body mass index (BMI) 30.0-30.9, adult: Secondary | ICD-10-CM | POA: Diagnosis not present

## 2020-04-23 DIAGNOSIS — Z87891 Personal history of nicotine dependence: Secondary | ICD-10-CM | POA: Diagnosis not present

## 2020-04-23 LAB — CBC WITH DIFFERENTIAL/PLATELET
Abs Immature Granulocytes: 0.03 10*3/uL (ref 0.00–0.07)
Basophils Absolute: 0 10*3/uL (ref 0.0–0.1)
Basophils Relative: 1 %
Eosinophils Absolute: 0 10*3/uL (ref 0.0–0.5)
Eosinophils Relative: 0 %
HCT: 42.1 % (ref 39.0–52.0)
Hemoglobin: 14.7 g/dL (ref 13.0–17.0)
Immature Granulocytes: 1 %
Lymphocytes Relative: 14 %
Lymphs Abs: 0.5 10*3/uL — ABNORMAL LOW (ref 0.7–4.0)
MCH: 31.5 pg (ref 26.0–34.0)
MCHC: 34.9 g/dL (ref 30.0–36.0)
MCV: 90.3 fL (ref 80.0–100.0)
Monocytes Absolute: 0.4 10*3/uL (ref 0.1–1.0)
Monocytes Relative: 10 %
Neutro Abs: 2.8 10*3/uL (ref 1.7–7.7)
Neutrophils Relative %: 74 %
Platelets: 210 10*3/uL (ref 150–400)
RBC: 4.66 MIL/uL (ref 4.22–5.81)
RDW: 11.9 % (ref 11.5–15.5)
WBC: 3.8 10*3/uL — ABNORMAL LOW (ref 4.0–10.5)
nRBC: 0 % (ref 0.0–0.2)

## 2020-04-23 LAB — COMPREHENSIVE METABOLIC PANEL
ALT: 58 U/L — ABNORMAL HIGH (ref 0–44)
AST: 74 U/L — ABNORMAL HIGH (ref 15–41)
Albumin: 3.4 g/dL — ABNORMAL LOW (ref 3.5–5.0)
Alkaline Phosphatase: 62 U/L (ref 38–126)
Anion gap: 9 (ref 5–15)
BUN: 26 mg/dL — ABNORMAL HIGH (ref 6–20)
CO2: 22 mmol/L (ref 22–32)
Calcium: 8.3 mg/dL — ABNORMAL LOW (ref 8.9–10.3)
Chloride: 106 mmol/L (ref 98–111)
Creatinine, Ser: 1.4 mg/dL — ABNORMAL HIGH (ref 0.61–1.24)
GFR, Estimated: >60 mL/min (ref >60)
Glucose, Bld: 186 mg/dL — ABNORMAL HIGH (ref 70–99)
Potassium: 4.3 mmol/L (ref 3.5–5.1)
Sodium: 137 mmol/L (ref 135–145)
Total Bilirubin: 0.6 mg/dL (ref 0.3–1.2)
Total Protein: 7 g/dL (ref 6.5–8.1)

## 2020-04-23 LAB — FERRITIN: Ferritin: 2331 ng/mL — ABNORMAL HIGH (ref 24–336)

## 2020-04-23 LAB — D-DIMER, QUANTITATIVE: D-Dimer, Quant: 1.1 ug/mL-FEU — ABNORMAL HIGH (ref 0.00–0.50)

## 2020-04-23 LAB — C-REACTIVE PROTEIN: CRP: 11.3 mg/dL — ABNORMAL HIGH (ref <1.0)

## 2020-04-23 NOTE — Progress Notes (Signed)
PROGRESS NOTE  Shaun Bentley  VFI:[id number] DOB: [birth year] DOA: 04/21/2020 PCP: Sharlene [name], DO   Brief Narrative: Shaun Bentley is a 34 y.o. male with a history of MS in remission, GERD, and recent covid-19 diagnosis who presented to the ED 11/8 with 8 days of viral symptoms found to be febrile, tachycardic, tachypneic, and hypoxic with AKI (Cr2.44), elevated LFTs (AST 101, ALT 60), elevated inflammatory markers (CRP 19.7, PCT 0.64, d-dimer 2.27) and bilateral patchy infiltrates on CXR. Remdesivir and steroids were given and admission requested for acute hypoxic respiratory failure due to covid-19 pneumonia.   Assessment & Plan: Principal Problem:   Pneumonia due to COVID-19 virus Active Problems:   MS (multiple sclerosis) (HCC)   GERD (gastroesophageal reflux disease)   AKI (acute kidney injury) (HCC)   Acute hypoxemic respiratory failure due to COVID-19 [facility])  Acute hypoxemic respiratory failure due to covid-19 pneumonia and possible superimposed bacterial CAP:  - Continue remdesivir, empiric abx due to elevated PCT, and steroids. Wean oxygen as tolerated, can DC home once off oxygen. CRP improving 19 > 11.  - Encourage OOB, IS, FV, and awake proning if able - Continue airborne, contact precautions for 21 days from positive testing. - Monitor CMP and inflammatory markers - Enoxaparin prophylactic dose.   AKI: Due to poor po intake/GI symptoms, improving. SCr 2.4 > 1.4.  - Avoid nephrotoxins.   Multiple sclerosis: Remains in remission.   Obesity: Estimated body mass index is 30.61 kg/m as calculated from the following:   Height as of this encounter: 6\' 1"  (1.854 m).   Weight as of this encounter: 105.2 kg.  DVT prophylaxis: Lovenox Code Status: Full Family Communication: None at bedside Disposition Plan:  Status is: Inpatient  Remains inpatient appropriate because:Inpatient level of care appropriate due to severity of illness  Dispo: The patient is from:  Home              Anticipated d/c is to: Home              Anticipated d/c date is: 1 day              Patient currently is not medically stable to d/c.  Consultants:   None  Procedures:   None  Antimicrobials:  Remdesivir   Subjective: Feels better, more strength. Has been getting up with mild dyspnea, sitting and eating breakfast without oxygen on, denies dyspnea, SpO2 nadir 89% at rest.  Objective: Vitals:   04/22/20 1816 04/22/20 2209 04/23/20 0217 04/23/20 0545  BP: 127/84 124/72 124/67 108/65  Pulse: 88 79 (!) 52 63  Resp: (!) 22 15 15 18   Temp: 99 F (37.2 C) 98.2 F (36.8 C) 98 F (36.7 C) 97.6 F (36.4 C)  TempSrc:    Oral  SpO2: 94% 95% 98% 98%  Weight:      Height:        Intake/Output Summary (Last 24 hours) at 04/23/2020 1624 Last data filed at 04/23/2020 1510 Gross per 24 hour  Intake 1498 ml  Output --  Net 1498 ml   Filed Weights   04/21/20 2340  Weight: 105.2 kg    Gen: 34 y.o. male in no distress Pulm: Non-labored breathing without crackles or wheezes.  CV: Regular rate and rhythm. No murmur, rub, or gallop. No JVD, no pedal edema. GI: Abdomen soft, non-tender, non-distended, with normoactive bowel sounds. No organomegaly or masses felt. Ext: Warm, no deformities Skin: No rashes, lesions or ulcers Neuro: Alert and oriented.  No focal neurological deficits. Psych: Judgement and insight appear normal. Mood & affect appropriate.   Data Reviewed: I have personally reviewed following labs and imaging studies  CBC: Recent Labs  Lab 04/22/20 0105 04/23/20 0422  WBC 4.9 3.8*  NEUTROABS 4.1 2.8  HGB 15.9 14.7  HCT 44.9 42.1  MCV 88.0 90.3  PLT 163 210   Basic Metabolic Panel: Recent Labs  Lab 04/22/20 0105 04/22/20 0344 04/23/20 0422  NA 136  --  137  K 3.8  --  4.3  CL 103  --  106  CO2 21*  --  22  GLUCOSE 144*  --  186*  BUN 24*  --  26*  CREATININE 2.44*  --  1.40*  CALCIUM 8.3*  --  8.3*  MG  --  2.0  --   PHOS  --   3.4  --    GFR: Estimated Creatinine Clearance: 92.9 mL/min (A) (by C-G formula based on SCr of 1.4 mg/dL (H)). Liver Function Tests: Recent Labs  Lab 04/22/20 0105 04/23/20 0422  AST 101* 74*  ALT 60* 58*  ALKPHOS 67 62  BILITOT 0.8 0.6  PROT 7.1 7.0  ALBUMIN 3.6 3.4*   No results for input(s): LIPASE, AMYLASE in the last 168 hours. No results for input(s): AMMONIA in the last 168 hours. Coagulation Profile: No results for input(s): INR, PROTIME in the last 168 hours. Cardiac Enzymes: No results for input(s): CKTOTAL, CKMB, CKMBINDEX, TROPONINI in the last 168 hours. BNP (last 3 results) No results for input(s): PROBNP in the last 8760 hours. HbA1C: No results for input(s): HGBA1C in the last 72 hours. CBG: No results for input(s): GLUCAP in the last 168 hours. Lipid Profile: Recent Labs    04/22/20 0105  TRIG 182*   Thyroid Function Tests: No results for input(s): TSH, T4TOTAL, FREET4, T3FREE, THYROIDAB in the last 72 hours. Anemia Panel: Recent Labs    04/22/20 0105 04/23/20 0422  FERRITIN 2,918* 2,331*   Urine analysis:    Component Value Date/Time   COLORURINE YELLOW 04/22/2020 0900   APPEARANCEUR CLEAR 04/22/2020 0900   LABSPEC 1.018 04/22/2020 0900   PHURINE 5.0 04/22/2020 0900   GLUCOSEU NEGATIVE 04/22/2020 0900   GLUCOSEU NEGATIVE 07/19/2014 1146   HGBUR NEGATIVE 04/22/2020 0900   BILIRUBINUR NEGATIVE 04/22/2020 0900   BILIRUBINUR neg 01/16/2018 1802   KETONESUR 5 (A) 04/22/2020 0900   PROTEINUR 100 (A) 04/22/2020 0900   UROBILINOGEN 1.0 01/16/2018 1802   UROBILINOGEN 0.2 07/19/2014 1146   NITRITE NEGATIVE 04/22/2020 0900   LEUKOCYTESUR NEGATIVE 04/22/2020 0900   Recent Results (from the past 240 hour(s))  Respiratory Panel by RT PCR (Flu A&B, Covid) - Nasopharyngeal Swab     Status: Abnormal   Collection Time: 04/22/20  1:05 AM   Specimen: Nasopharyngeal Swab  Result Value Ref Range Status   SARS Coronavirus 2 by RT PCR POSITIVE (A)  NEGATIVE Final    Comment: RESULT CALLED TO, READ BACK BY AND VERIFIED WITH: LYNCH J. 11.9.21 @ 0432 BY MECIAL J. (NOTE) SARS-CoV-2 target nucleic acids are DETECTED.  SARS-CoV-2 RNA is generally detectable in upper respiratory specimens  during the acute phase of infection. Positive results are indicative of the presence of the identified virus, but do not rule out bacterial infection or co-infection with other pathogens not detected by the test. Clinical correlation with patient history and other diagnostic information is necessary to determine patient infection status. The expected result is Negative.  Fact Sheet for Patients:  https://www.moore.com/  Fact Sheet for Healthcare Providers: https://www.young.biz/  This test is not yet approved or cleared by the [place] FDA and  has been authorized for detection and/or diagnosis of SARS-CoV-2 by FDA under an Emergency Use Authorization (EUA).  This EUA will remain in effect (meaning this test can b e used) for the duration of  the COVID-19 declaration under Section 564(b)(1) of the Act, 21 U.S.C. section 360bbb-3(b)(1), unless the authorization is terminated or revoked sooner.      Influenza A by PCR NEGATIVE NEGATIVE Final   Influenza B by PCR NEGATIVE NEGATIVE Final    Comment: (NOTE) The Xpert Xpress SARS-CoV-2/FLU/RSV assay is intended as an aid in  the diagnosis of influenza from Nasopharyngeal swab specimens and  should not be used as a sole basis for treatment. Nasal washings and  aspirates are unacceptable for Xpert Xpress SARS-CoV-2/FLU/RSV  testing.  Fact Sheet for Patients: https://www.moore.com/  Fact Sheet for Healthcare Providers: https://www.young.biz/  This test is not yet approved or cleared by the [place] FDA and  has been authorized for detection and/or diagnosis of SARS-CoV-2 by  FDA under an Emergency Use  Authorization (EUA). This EUA will remain  in effect (meaning this test can be used) for the duration of the  Covid-19 declaration under Section 564(b)(1) of the Act, 21  U.S.C. section 360bbb-3(b)(1), unless the authorization is  terminated or revoked. Performed at [facility], [address], [place], [place] [address]   Blood Culture (routine x 2)     Status: None (Preliminary result)   Collection Time: 04/22/20  1:05 AM   Specimen: BLOOD RIGHT FOREARM  Result Value Ref Range Status   Specimen Description   Final    BLOOD RIGHT FOREARM Performed at [facility], [address], [place], [place] [address]    Special Requests   Final    BOTTLES DRAWN AEROBIC AND ANAEROBIC Blood Culture adequate volume Performed at [facility], [address] [place], [place] [address]    Culture   Final    NO GROWTH 1 DAY Performed at [facility], [address], [place], [place] [address]    Report Status PENDING  Incomplete  Blood Culture (routine x 2)     Status: None (Preliminary result)   Collection Time: 04/22/20  1:05 AM   Specimen: BLOOD LEFT FOREARM  Result Value Ref Range Status   Specimen Description   Final    BLOOD LEFT FOREARM Performed at [facility], [address] [place], [place] [address]    Special Requests   Final    BOTTLES DRAWN AEROBIC AND ANAEROBIC Blood Culture adequate volume Performed at [facility], [address], [place], [place] [address]    Culture   Final    NO GROWTH 1 DAY Performed at [facility], [address] [place], [place] [address]    Report Status PENDING  Incomplete      Radiology Studies: DG Chest Port 1 View  Result Date: 04/22/2020 CLINICAL DATA:  Dyspnea, COVID-19 positive 2 days prior EXAM: PORTABLE CHEST 1 VIEW COMPARISON:  Radiograph 07/27/2011 FINDINGS: Patchy opacities are present throughout both lungs in a mid to  lower lung and peripheral predominance. No pneumothorax or effusion. Cardiomediastinal contours are unremarkable for portable technique. Telemetry leads overlie the chest. No acute osseous or soft tissue abnormality. IMPRESSION: Patchy opacities throughout both lungs, suspicious for atypical infection in the setting of COVID-19. Electronically Signed  By: Kreg Shropshire M.D.   On: 04/22/2020 00:25    Scheduled Meds: . aerochamber Z-Stat Plus/medium  1 each Other Once  . albuterol  2 puff Inhalation Q6H  . vitamin C  500 mg Oral Daily  . dexamethasone (DECADRON) injection  6 mg Intravenous Q24H  . enoxaparin (LOVENOX) injection  40 mg Subcutaneous Q24H  . famotidine  20 mg Oral BID  . zinc sulfate  220 mg Oral Daily   Continuous Infusions: . azithromycin 500 mg (04/23/20 0537)  . cefTRIAXone (ROCEPHIN)  IV 2 g (04/23/20 0500)  . remdesivir 100 mg in NS 100 mL 100 mg (04/23/20 1048)     LOS: 0 days   Time spent: 25 minutes.  Tyrone Nine, MD [facility] [email or web address] 04/23/2020, 4:24 PM

## 2020-04-24 DIAGNOSIS — J9601 Acute respiratory failure with hypoxia: Secondary | ICD-10-CM

## 2020-04-24 LAB — COMPREHENSIVE METABOLIC PANEL
ALT: 54 U/L — ABNORMAL HIGH (ref 0–44)
AST: 56 U/L — ABNORMAL HIGH (ref 15–41)
Albumin: 3.3 g/dL — ABNORMAL LOW (ref 3.5–5.0)
Alkaline Phosphatase: 61 U/L (ref 38–126)
Anion gap: 12 (ref 5–15)
BUN: 24 mg/dL — ABNORMAL HIGH (ref 6–20)
CO2: 21 mmol/L — ABNORMAL LOW (ref 22–32)
Calcium: 8.7 mg/dL — ABNORMAL LOW (ref 8.9–10.3)
Chloride: 107 mmol/L (ref 98–111)
Creatinine, Ser: 1.27 mg/dL — ABNORMAL HIGH (ref 0.61–1.24)
GFR, Estimated: >60 mL/min (ref >60)
Glucose, Bld: 175 mg/dL — ABNORMAL HIGH (ref 70–99)
Potassium: 4.1 mmol/L (ref 3.5–5.1)
Sodium: 140 mmol/L (ref 135–145)
Total Bilirubin: 0.7 mg/dL (ref 0.3–1.2)
Total Protein: 6.9 g/dL (ref 6.5–8.1)

## 2020-04-24 LAB — D-DIMER, QUANTITATIVE: D-Dimer, Quant: 0.8 ug/mL-FEU — ABNORMAL HIGH (ref 0.00–0.50)

## 2020-04-24 LAB — C-REACTIVE PROTEIN: CRP: 4 mg/dL — ABNORMAL HIGH (ref <1.0)

## 2020-04-24 MED ORDER — DEXAMETHASONE 6 MG PO TABS: 6.0000 mg | ORAL_TABLET | Freq: Every day | ORAL | 0 refills | Status: DC

## 2020-04-24 MED ORDER — SALINE SPRAY 0.65 % NA SOLN
1.0000 | NASAL | Status: DC | PRN
  Filled 2020-04-24: qty 44

## 2020-04-24 NOTE — Discharge Summary (Signed)
Physician Discharge Summary  Shaun Bentley EXB:[id number] DOB: 04-26-1986 DOA: 04/21/2020  PCP: Sharlene [name], DO  Admit date: 04/21/2020 Discharge date: 04/24/2020  Admitted From: Home Disposition: Home   Recommendations for Outpatient Follow-up:  1. Follow up with PCP in 1-2 weeks 2. Please obtain CMP at follow up.  Home Health: None Equipment/Devices: None Discharge Condition: Stable CODE STATUS: Full Diet recommendation: Regular  Brief/Interim Summary: Shaun Bentley is a 34 y.o. male with a history of MS in remission, GERD, and recent covid-19 diagnosis who presented to the ED 11/8 with 8 days of viral symptoms found to be febrile, tachycardic, tachypneic, and hypoxic with AKI (Cr2.44), elevated LFTs (AST 101, ALT 60), elevated inflammatory markers (CRP 19.7, PCT 0.64, d-dimer 2.27) and bilateral patchy infiltrates on CXR. Remdesivir and steroids were given and admission requested for acute hypoxic respiratory failure due to covid-19 pneumonia. Hypoxia resolved rapidly and respiratory effort has normalized. He is stable for discharge and will continue steroids at home in isolation with planned televisit PCP follow up in the next week.  Discharge Diagnoses:  Principal Problem:   Pneumonia due to COVID-19 virus Active Problems:   MS (multiple sclerosis) (HCC)   GERD (gastroesophageal reflux disease)   AKI (acute kidney injury) (HCC)   Acute hypoxemic respiratory failure due to COVID-19 [facility])  Acute hypoxemic respiratory failure due to covid-19 pneumonia and possible superimposed bacterial CAP:  - Has completed remdesivir x3 doses, also has received 3 days of antibiotics. Pt is off oxygen x24 hours without focal infiltrate on CXR or other nidus of bacterial infection. Bacterial pneumonia is felt to be less likely than purely viral pneumonia. Will continue steroids at home. CRP improving 19 > 11 > 4.  - Encourage OOB, IS, FV, and awake proning if able - Continue airborne,  contact precautions for 21 days from positive testing. - Monitor CMP and inflammatory markers  - Enoxaparin prophylactic dose.  LFT elevation: Improving, this was likely due to covid.  - Recheck at follow up recommended.  AKI: Due to poor po intake/GI symptoms, improving. SCr 2.4 > 1.4 > 1.2  - Avoid nephrotoxins.  - Consider recheck at follow up.  Multiple sclerosis: Remains in remission without treatment.  Obesity: Estimated body mass index is 30.61 kg/m   Discharge Instructions Discharge Instructions    Diet - low sodium heart healthy   Complete by: As directed    Discharge instructions   Complete by: As directed    You are being discharged from the hospital after treatment for covid-19 infection. You are felt to be stable enough to no longer require inpatient monitoring, testing, and treatment, though you will need to follow the recommendations below: - Continue taking decadron (steroid) for 7 more days).  - Per CDC guidelines, you will need to remain in isolation for 21 days from your first positive covid test (which was a home test 04/15/2020). - Follow up with your doctor in the next week via telehealth or seek medical attention right away if your symptoms get WORSE.  - You are still encouraged to get a covid vaccination between 21 days (after isolation period ends) and 90 days (before immunity is thought to wear off).  Directions for you at home:  Wear a facemask You should wear a facemask that covers your nose and mouth when you are in the same room with other people and when you visit a healthcare provider. People who live with or visit you should also wear a facemask while  they are in the same room with you.  Separate yourself from other people in your home As much as possible, you should stay in a different room from other people in your home. Also, you should use a separate bathroom, if available.  Avoid sharing household items You should not share dishes,  drinking glasses, cups, eating utensils, towels, bedding, or other items with other people in your home. After using these items, you should wash them thoroughly with soap and water.  Cover your coughs and sneezes Cover your mouth and nose with a tissue when you cough or sneeze, or you can cough or sneeze into your sleeve. Throw used tissues in a lined trash can, and immediately wash your hands with soap and water for at least 20 seconds or use an alcohol-based hand rub.  Wash your [employer] your hands often and thoroughly with soap and water for at least 20 seconds. You can use an alcohol-based hand sanitizer if soap and water are not available and if your hands are not visibly dirty. Avoid touching your eyes, nose, and mouth with unwashed hands.  Directions for those who live with, or provide care at home for you:  Limit the number of people who have contact with the patient If possible, have only one caregiver for the patient. Other household members should stay in another home or place of residence. If this is not possible, they should stay in another room, or be separated from the patient as much as possible. Use a separate bathroom, if available. Restrict visitors who do not have an essential need to be in the home.  Ensure good ventilation Make sure that shared spaces in the home have good air flow, such as from an air conditioner or an opened window, weather permitting.  Wash your hands often Wash your hands often and thoroughly with soap and water for at least 20 seconds. You can use an alcohol based hand sanitizer if soap and water are not available and if your hands are not visibly dirty. Avoid touching your eyes, nose, and mouth with unwashed hands. Use disposable paper towels to dry your hands. If not available, use dedicated cloth towels and replace them when they become wet.  Wear a facemask and gloves Wear a disposable facemask at all times in the room and gloves when  you touch or have contact with the patient's blood, body fluids, and/or secretions or excretions, such as sweat, saliva, sputum, nasal mucus, vomit, urine, or feces.  Ensure the mask fits over your nose and mouth tightly, and do not touch it during use. Throw out disposable facemasks and gloves after using them. Do not reuse. Wash your hands immediately after removing your facemask and gloves. If your personal clothing becomes contaminated, carefully remove clothing and launder. Wash your hands after handling contaminated clothing. Place all used disposable facemasks, gloves, and other waste in a lined container before disposing them with other household waste. Remove gloves and wash your hands immediately after handling these items.  Do not share dishes, glasses, or other household items with the patient Avoid sharing household items. You should not share dishes, drinking glasses, cups, eating utensils, towels, bedding, or other items with a patient who is confirmed to have, or being evaluated for, COVID-19 infection. After the person uses these items, you should wash them thoroughly with soap and water.  Wash laundry thoroughly Immediately remove and wash clothes or bedding that have blood, body fluids, and/or secretions or excretions, such as sweat,  saliva, sputum, nasal mucus, vomit, urine, or feces, on them. Wear gloves when handling laundry from the patient. Read and follow directions on labels of laundry or clothing items and detergent. In general, wash and dry with the warmest temperatures recommended on the label.  Clean all areas the individual has used often Clean all touchable surfaces, such as counters, tabletops, doorknobs, bathroom fixtures, toilets, phones, keyboards, tablets, and bedside tables, every day. Also, clean any surfaces that may have blood, body fluids, and/or secretions or excretions on them. Wear gloves when cleaning surfaces the patient has come in contact with. Use  a diluted bleach solution (e.g., dilute bleach with 1 part bleach and 10 parts water) or a household disinfectant with a label that says EPA-registered for coronaviruses. To make a bleach solution at home, add 1 tablespoon of bleach to 1 quart (4 cups) of water. For a larger supply, add  cup of bleach to 1 gallon (16 cups) of water. Read labels of cleaning products and follow recommendations provided on product labels. Labels contain instructions for safe and effective use of the cleaning product including precautions you should take when applying the product, such as wearing gloves or eye protection and making sure you have good ventilation during use of the product. Remove gloves and wash hands immediately after cleaning.  Monitor yourself for signs and symptoms of illness Caregivers and household members are considered close contacts, should monitor their health, and will be asked to limit movement outside of the home to the extent possible. Follow the monitoring steps for close contacts listed on the symptom monitoring form.  If you have additional questions, contact your local health department or call the epidemiologist on call at [phone] (available 24/7). This guidance is subject to change. For the most up-to-date guidance from [facility], please refer to their website: [email or web address]   Increase activity slowly   Complete by: As directed      Allergies as of 04/24/2020      Reactions   Bee Venom Anaphylaxis   Morphine And Related Swelling      Medication List    TAKE these medications   dexamethasone 6 MG tablet Commonly known as: DECADRON Take 1 tablet (6 mg total) by mouth daily.       Follow-up Information    Sharlene [name], DO. Schedule an appointment as soon as possible for a visit in 1 week(s).   Specialty: Family Medicine Why: by tele-health  Contact information: [address] [place] [address] [phone]              Allergies  Allergen Reactions  . Bee Venom Anaphylaxis  . Morphine And Related Swelling    Consultations:   Procedures/Studies: DG Chest Port 1 View  Result Date: 04/22/2020 CLINICAL DATA:  Dyspnea, COVID-19 positive 2 days prior EXAM: PORTABLE CHEST 1 VIEW COMPARISON:  Radiograph 07/27/2011 FINDINGS: Patchy opacities are present throughout both lungs in a mid to lower lung and peripheral predominance. No pneumothorax or effusion. Cardiomediastinal contours are unremarkable for portable technique. Telemetry leads overlie the chest. No acute osseous or soft tissue abnormality. IMPRESSION: Patchy opacities throughout both lungs, suspicious for atypical infection in the setting of COVID-19. Electronically Signed   By: Kreg Shropshire M.D.   On: 04/22/2020 00:25    Subjective: Feels much better, has been walking in the room without dyspnea, still some coughing but no chest pain or leg swelling. Dip tricep dips yesterday to stay active, has not needed  oxygen x24 hours.   Discharge Exam: Vitals:   04/24/20 0355 04/24/20 0900  BP: 102/90   Pulse: (!) 58   Resp: (!) 21 19  Temp: 98.1 F (36.7 C)   SpO2: 97% 92%   General: Pt is alert, awake, not in acute distress Cardiovascular: RRR, S1/S2 +, no rubs, no gallops Respiratory: CTA bilaterally, no wheezing, no rhonchi Abdominal: Soft, NT, ND, bowel sounds + Extremities: No edema, no cyanosis  Labs: BNP (last 3 results) No results for input(s): BNP in the last 8760 hours. Basic Metabolic Panel: Recent Labs  Lab 04/22/20 0105 04/22/20 0344 04/23/20 0422 04/24/20 0404  NA 136  --  137 140  K 3.8  --  4.3 4.1  CL 103  --  106 107  CO2 21*  --  22 21*  GLUCOSE 144*  --  186* 175*  BUN 24*  --  26* 24*  CREATININE 2.44*  --  1.40* 1.27*  CALCIUM 8.3*  --  8.3* 8.7*  MG  --  2.0  --   --   PHOS  --  3.4  --   --    Liver Function Tests: Recent Labs  Lab 04/22/20 0105  04/23/20 0422 04/24/20 0404  AST 101* 74* 56*  ALT 60* 58* 54*  ALKPHOS 67 62 61  BILITOT 0.8 0.6 0.7  PROT 7.1 7.0 6.9  ALBUMIN 3.6 3.4* 3.3*   No results for input(s): LIPASE, AMYLASE in the last 168 hours. No results for input(s): AMMONIA in the last 168 hours. CBC: Recent Labs  Lab 04/22/20 0105 04/23/20 0422  WBC 4.9 3.8*  NEUTROABS 4.1 2.8  HGB 15.9 14.7  HCT 44.9 42.1  MCV 88.0 90.3  PLT 163 210   Cardiac Enzymes: No results for input(s): CKTOTAL, CKMB, CKMBINDEX, TROPONINI in the last 168 hours. BNP: Invalid input(s): POCBNP CBG: No results for input(s): GLUCAP in the last 168 hours. D-Dimer Recent Labs    04/23/20 0422 04/24/20 0404  DDIMER 1.10* 0.80*   Hgb A1c No results for input(s): HGBA1C in the last 72 hours. Lipid Profile Recent Labs    04/22/20 0105  TRIG 182*   Thyroid function studies No results for input(s): TSH, T4TOTAL, T3FREE, THYROIDAB in the last 72 hours.  Invalid input(s): FREET3 Anemia work up Recent Labs    04/22/20 0105 04/23/20 0422  FERRITIN 2,918* 2,331*   Urinalysis    Component Value Date/Time   COLORURINE YELLOW 04/22/2020 0900   APPEARANCEUR CLEAR 04/22/2020 0900   LABSPEC 1.018 04/22/2020 0900   PHURINE 5.0 04/22/2020 0900   GLUCOSEU NEGATIVE 04/22/2020 0900   GLUCOSEU NEGATIVE 07/19/2014 1146   HGBUR NEGATIVE 04/22/2020 0900   BILIRUBINUR NEGATIVE 04/22/2020 0900   BILIRUBINUR neg 01/16/2018 1802   KETONESUR 5 (A) 04/22/2020 0900   PROTEINUR 100 (A) 04/22/2020 0900   UROBILINOGEN 1.0 01/16/2018 1802   UROBILINOGEN 0.2 07/19/2014 1146   NITRITE NEGATIVE 04/22/2020 0900   LEUKOCYTESUR NEGATIVE 04/22/2020 0900    Microbiology Recent Results (from the past 240 hour(s))  Respiratory Panel by RT PCR (Flu A&B, Covid) - Nasopharyngeal Swab     Status: Abnormal   Collection Time: 04/22/20  1:05 AM   Specimen: Nasopharyngeal Swab  Result Value Ref Range Status   SARS Coronavirus 2 by RT PCR POSITIVE (A)  NEGATIVE Final    Comment: RESULT CALLED TO, READ BACK BY AND VERIFIED WITH: LYNCH J. 11.9.21 @ 0432 BY MECIAL J. (NOTE) SARS-CoV-2 target nucleic acids are DETECTED.  SARS-CoV-2  RNA is generally detectable in upper respiratory specimens  during the acute phase of infection. Positive results are indicative of the presence of the identified virus, but do not rule out bacterial infection or co-infection with other pathogens not detected by the test. Clinical correlation with patient history and other diagnostic information is necessary to determine patient infection status. The expected result is Negative.  Fact Sheet for Patients:  https://www.moore.com/  Fact Sheet for Healthcare Providers: https://www.young.biz/  This test is not yet approved or cleared by the [place] FDA and  has been authorized for detection and/or diagnosis of SARS-CoV-2 by FDA under an Emergency Use Authorization (EUA).  This EUA will remain in effect (meaning this test can b e used) for the duration of  the COVID-19 declaration under Section 564(b)(1) of the Act, 21 U.S.C. section 360bbb-3(b)(1), unless the authorization is terminated or revoked sooner.      Influenza A by PCR NEGATIVE NEGATIVE Final   Influenza B by PCR NEGATIVE NEGATIVE Final    Comment: (NOTE) The Xpert Xpress SARS-CoV-2/FLU/RSV assay is intended as an aid in  the diagnosis of influenza from Nasopharyngeal swab specimens and  should not be used as a sole basis for treatment. Nasal washings and  aspirates are unacceptable for Xpert Xpress SARS-CoV-2/FLU/RSV  testing.  Fact Sheet for Patients: https://www.moore.com/  Fact Sheet for Healthcare Providers: https://www.young.biz/  This test is not yet approved or cleared by the [place] FDA and  has been authorized for detection and/or diagnosis of SARS-CoV-2 by  FDA under an Emergency Use  Authorization (EUA). This EUA will remain  in effect (meaning this test can be used) for the duration of the  Covid-19 declaration under Section 564(b)(1) of the Act, 21  U.S.C. section 360bbb-3(b)(1), unless the authorization is  terminated or revoked. Performed at [facility], [address], [place], [place] [address]   Blood Culture (routine x 2)     Status: None (Preliminary result)   Collection Time: 04/22/20  1:05 AM   Specimen: BLOOD RIGHT FOREARM  Result Value Ref Range Status   Specimen Description   Final    BLOOD RIGHT FOREARM Performed at [facility], [address], [place], [place] [address]    Special Requests   Final    BOTTLES DRAWN AEROBIC AND ANAEROBIC Blood Culture adequate volume Performed at [facility], [address], [place], [place] [address]    Culture   Final    NO GROWTH 2 DAYS Performed at [facility], [address] [place], [place] [address]    Report Status PENDING  Incomplete  Blood Culture (routine x 2)     Status: None (Preliminary result)   Collection Time: 04/22/20  1:05 AM   Specimen: BLOOD LEFT FOREARM  Result Value Ref Range Status   Specimen Description   Final    BLOOD LEFT FOREARM Performed at [facility], [address], [place], [place] [address]    Special Requests   Final    BOTTLES DRAWN AEROBIC AND ANAEROBIC Blood Culture adequate volume Performed at [facility], [address] [place], [place] [address]    Culture   Final    NO GROWTH 2 DAYS Performed at [facility], [address], [place], [place] [address]    Report Status PENDING  Incomplete    Time coordinating discharge: Approximately 40 minutes  Tyrone Nine, MD  [facility] 04/24/2020, 10:52 AM

## 2020-04-24 NOTE — Plan of Care (Signed)
  Problem: Health Behavior/Discharge Planning: Goal: Ability to manage health-related needs will improve Outcome: Adequate for Discharge   Problem: Activity: Goal: Risk for activity intolerance will decrease Outcome: Adequate for Discharge   Problem: Elimination: Goal: Will not experience complications related to bowel motility Outcome: Adequate for Discharge Goal: Will not experience complications related to urinary retention Outcome: Adequate for Discharge   Problem: Pain Managment: Goal: General experience of comfort will improve Outcome: Adequate for Discharge   Problem: Safety: Goal: Ability to remain free from injury will improve Outcome: Adequate for Discharge   Problem: Education: Goal: Knowledge of General Education information will improve Description: Including pain rating scale, medication(s)/side effects and non-pharmacologic comfort measures Outcome: Adequate for Discharge   Problem: Health Behavior/Discharge Planning: Goal: Ability to manage health-related needs will improve Outcome: Adequate for Discharge   Problem: Clinical Measurements: Goal: Ability to maintain clinical measurements within normal limits will improve Outcome: Adequate for Discharge Goal: Will remain free from infection Outcome: Adequate for Discharge Goal: Diagnostic test results will improve Outcome: Adequate for Discharge Goal: Respiratory complications will improve Outcome: Adequate for Discharge Goal: Cardiovascular complication will be avoided Outcome: Adequate for Discharge   Problem: Activity: Goal: Risk for activity intolerance will decrease Outcome: Adequate for Discharge   Problem: Nutrition: Goal: Adequate nutrition will be maintained Outcome: Adequate for Discharge   Problem: Coping: Goal: Level of anxiety will decrease Outcome: Adequate for Discharge   Problem: Elimination: Goal: Will not experience complications related to bowel motility Outcome: Adequate for  Discharge Goal: Will not experience complications related to urinary retention Outcome: Adequate for Discharge   Problem: Pain Managment: Goal: General experience of comfort will improve Outcome: Adequate for Discharge   Problem: Safety: Goal: Ability to remain free from injury will improve Outcome: Adequate for Discharge   Problem: Skin Integrity: Goal: Risk for impaired skin integrity will decrease Outcome: Adequate for Discharge   Problem: Education: Goal: Knowledge of risk factors and measures for prevention of condition will improve Outcome: Adequate for Discharge   Problem: Coping: Goal: Psychosocial and spiritual needs will be supported Outcome: Adequate for Discharge   Problem: Respiratory: Goal: Will maintain a patent airway Outcome: Adequate for Discharge Goal: Complications related to the disease process, condition or treatment will be avoided or minimized Outcome: Adequate for Discharge

## 2020-04-24 NOTE — Progress Notes (Signed)
Discharge instruction given and Pt stated he understood.

## 2020-04-24 NOTE — Progress Notes (Signed)
Noted on monitor patient's HR 120-130, patient up to bathroom. Will return to NSR when transferring back to bed.

## 2020-04-24 NOTE — Discharge Instructions (Signed)
Discharge Instructions    Diet - low sodium heart healthy   Complete by: As directed    Discharge instructions   Complete by: As directed    You are being discharged from the hospital after treatment for covid-19 infection. You are felt to be stable enough to no longer require inpatient monitoring, testing, and treatment, though you will need to follow the recommendations below: - Continue taking decadron (steroid) for 7 more days).  - Per CDC guidelines, you will need to remain in isolation for 21 days from your first positive covid test (which was a home test 04/15/2020). - Follow up with your doctor in the next week via telehealth or seek medical attention right away if your symptoms get WORSE.  - You are still encouraged to get a covid vaccination between 21 days (after isolation period ends) and 90 days (before immunity is thought to wear off).  Directions for you at home:  Wear a facemask You should wear a facemask that covers your nose and mouth when you are in the same room with other people and when you visit a healthcare provider. People who live with or visit you should also wear a facemask while they are in the same room with you.  Separate yourself from other people in your home As much as possible, you should stay in a different room from other people in your home. Also, you should use a separate bathroom, if available.  Avoid sharing household items You should not share dishes, drinking glasses, cups, eating utensils, towels, bedding, or other items with other people in your home. After using these items, you should wash them thoroughly with soap and water.  Cover your coughs and sneezes Cover your mouth and nose with a tissue when you cough or sneeze, or you can cough or sneeze into your sleeve. Throw used tissues in a lined trash can, and immediately wash your hands with soap and water for at least 20 seconds or use an alcohol-based hand rub.  Wash your  [employer] your hands often and thoroughly with soap and water for at least 20 seconds. You can use an alcohol-based hand sanitizer if soap and water are not available and if your hands are not visibly dirty. Avoid touching your eyes, nose, and mouth with unwashed hands.  Directions for those who live with, or provide care at home for you:  Limit the number of people who have contact with the patient If possible, have only one caregiver for the patient. Other household members should stay in another home or place of residence. If this is not possible, they should stay in another room, or be separated from the patient as much as possible. Use a separate bathroom, if available. Restrict visitors who do not have an essential need to be in the home.  Ensure good ventilation Make sure that shared spaces in the home have good air flow, such as from an air conditioner or an opened window, weather permitting.  Wash your hands often Wash your hands often and thoroughly with soap and water for at least 20 seconds. You can use an alcohol based hand sanitizer if soap and water are not available and if your hands are not visibly dirty. Avoid touching your eyes, nose, and mouth with unwashed hands. Use disposable paper towels to dry your hands. If not available, use dedicated cloth towels and replace them when they become wet.  Wear a facemask and gloves Wear a disposable facemask at all times  in the room and gloves when you touch or have contact with the patient's blood, body fluids, and/or secretions or excretions, such as sweat, saliva, sputum, nasal mucus, vomit, urine, or feces.  Ensure the mask fits over your nose and mouth tightly, and do not touch it during use. Throw out disposable facemasks and gloves after using them. Do not reuse. Wash your hands immediately after removing your facemask and gloves. If your personal clothing becomes contaminated, carefully remove clothing and launder.  Wash your hands after handling contaminated clothing. Place all used disposable facemasks, gloves, and other waste in a lined container before disposing them with other household waste. Remove gloves and wash your hands immediately after handling these items.  Do not share dishes, glasses, or other household items with the patient Avoid sharing household items. You should not share dishes, drinking glasses, cups, eating utensils, towels, bedding, or other items with a patient who is confirmed to have, or being evaluated for, COVID-19 infection. After the person uses these items, you should wash them thoroughly with soap and water.  Wash laundry thoroughly Immediately remove and wash clothes or bedding that have blood, body fluids, and/or secretions or excretions, such as sweat, saliva, sputum, nasal mucus, vomit, urine, or feces, on them. Wear gloves when handling laundry from the patient. Read and follow directions on labels of laundry or clothing items and detergent. In general, wash and dry with the warmest temperatures recommended on the label.  Clean all areas the individual has used often Clean all touchable surfaces, such as counters, tabletops, doorknobs, bathroom fixtures, toilets, phones, keyboards, tablets, and bedside tables, every day. Also, clean any surfaces that may have blood, body fluids, and/or secretions or excretions on them. Wear gloves when cleaning surfaces the patient has come in contact with. Use a diluted bleach solution (e.g., dilute bleach with 1 part bleach and 10 parts water) or a household disinfectant with a label that says EPA-registered for coronaviruses. To make a bleach solution at home, add 1 tablespoon of bleach to 1 quart (4 cups) of water. For a larger supply, add  cup of bleach to 1 gallon (16 cups) of water. Read labels of cleaning products and follow recommendations provided on product labels. Labels contain instructions for safe and effective use of  the cleaning product including precautions you should take when applying the product, such as wearing gloves or eye protection and making sure you have good ventilation during use of the product. Remove gloves and wash hands immediately after cleaning.  Monitor yourself for signs and symptoms of illness Caregivers and household members are considered close contacts, should monitor their health, and will be asked to limit movement outside of the home to the extent possible. Follow the monitoring steps for close contacts listed on the symptom monitoring form.  If you have additional questions, contact your local health department or call the epidemiologist on call at [phone] (available 24/7). This guidance is subject to change. For the most up-to-date guidance from CDC, please refer to their website: TripMetro.hu   Increase activity slowly   Complete by: As directed        COVID-19 COVID-19 is a respiratory infection that is caused by a virus called severe acute respiratory syndrome coronavirus 2 (SARS-CoV-2). The disease is also known as coronavirus disease or novel coronavirus. In some people, the virus may not cause any symptoms. In others, it may cause a serious infection. The infection can get worse quickly and can lead to complications, such as:  Pneumonia, or infection of the lungs.  Acute respiratory distress syndrome or ARDS. This is a condition in which fluid build-up in the lungs prevents the lungs from filling with air and passing oxygen into the blood.  Acute respiratory failure. This is a condition in which there is not enough oxygen passing from the lungs to the body or when carbon dioxide is not passing from the lungs out of the body.  Sepsis or septic shock. This is a serious bodily reaction to an infection.  Blood clotting problems.  Secondary infections due to bacteria or fungus.  Organ failure.  This is when your body's organs stop working. The virus that causes COVID-19 is contagious. This means that it can spread from person to person through droplets from coughs and sneezes (respiratory secretions). What are the causes? This illness is caused by a virus. You may catch the virus by:  Breathing in droplets from an infected person. Droplets can be spread by a person breathing, speaking, singing, coughing, or sneezing.  Touching something, like a table or a doorknob, that was exposed to the virus (contaminated) and then touching your mouth, nose, or eyes. What increases the risk? Risk for infection You are more likely to be infected with this virus if you:  Are within 6 feet (2 meters) of a person with COVID-19.  Provide care for or live with a person who is infected with COVID-19.  Spend time in crowded indoor spaces or live in shared housing. Risk for serious illness You are more likely to become seriously ill from the virus if you:  Are 63 years of age or older. The higher your age, the more you are at risk for serious illness.  Live in a nursing home or long-term care facility.  Have cancer.  Have a long-term (chronic) disease such as: ? Chronic lung disease, including chronic obstructive pulmonary disease or asthma. ? A long-term disease that lowers your body's ability to fight infection (immunocompromised). ? Heart disease, including heart failure, a condition in which the arteries that lead to the heart become narrow or blocked (coronary artery disease), a disease which makes the heart muscle thick, weak, or stiff (cardiomyopathy). ? Diabetes. ? Chronic kidney disease. ? Sickle cell disease, a condition in which red blood cells have an abnormal "sickle" shape. ? Liver disease.  Are obese. What are the signs or symptoms? Symptoms of this condition can range from mild to severe. Symptoms may appear any time from 2 to 14 days after being exposed to the virus. They  include:  A fever or chills.  A cough.  Difficulty breathing.  Headaches, body aches, or muscle aches.  Runny or stuffy (congested) nose.  A sore throat.  New loss of taste or smell. Some people may also have stomach problems, such as nausea, vomiting, or diarrhea. Other people may not have any symptoms of COVID-19. How is this diagnosed? This condition may be diagnosed based on:  Your signs and symptoms, especially if: ? You live in an area with a COVID-19 outbreak. ? You recently traveled to or from an area where the virus is common. ? You provide care for or live with a person who was diagnosed with COVID-19. ? You were exposed to a person who was diagnosed with COVID-19.  A physical exam.  Lab tests, which may include: ? Taking a sample of fluid from the back of your nose and throat (nasopharyngeal fluid), your nose, or your throat using a swab. ? A sample  of mucus from your lungs (sputum). ? Blood tests.  Imaging tests, which may include, X-rays, CT scan, or ultrasound. How is this treated? At present, there is no medicine to treat COVID-19. Medicines that treat other diseases are being used on a trial basis to see if they are effective against COVID-19. Your health care provider will talk with you about ways to treat your symptoms. For most people, the infection is mild and can be managed at home with rest, fluids, and over-the-counter medicines. Treatment for a serious infection usually takes places in a hospital intensive care unit (ICU). It may include one or more of the following treatments. These treatments are given until your symptoms improve.  Receiving fluids and medicines through an IV.  Supplemental oxygen. Extra oxygen is given through a tube in the nose, a face mask, or a hood.  Positioning you to lie on your stomach (prone position). This makes it easier for oxygen to get into the lungs.  Continuous positive airway pressure (CPAP) or bi-level positive  airway pressure (BPAP) machine. This treatment uses mild air pressure to keep the airways open. A tube that is connected to a motor delivers oxygen to the body.  Ventilator. This treatment moves air into and out of the lungs by using a tube that is placed in your windpipe.  Tracheostomy. This is a procedure to create a hole in the neck so that a breathing tube can be inserted.  Extracorporeal membrane oxygenation (ECMO). This procedure gives the lungs a chance to recover by taking over the functions of the heart and lungs. It supplies oxygen to the body and removes carbon dioxide. Follow these instructions at home: Lifestyle  If you are sick, stay home except to get medical care. Your health care provider will tell you how long to stay home. Call your health care provider before you go for medical care.  Rest at home as told by your health care provider.  Do not use any products that contain nicotine or tobacco, such as cigarettes, e-cigarettes, and chewing tobacco. If you need help quitting, ask your health care provider.  Return to your normal activities as told by your health care provider. Ask your health care provider what activities are safe for you. General instructions  Take over-the-counter and prescription medicines only as told by your health care provider.  Drink enough fluid to keep your urine pale yellow.  Keep all follow-up visits as told by your health care provider. This is important. How is this prevented?  There is no vaccine to help prevent COVID-19 infection. However, there are steps you can take to protect yourself and others from this virus. To protect yourself:   Do not travel to areas where COVID-19 is a risk. The areas where COVID-19 is reported change often. To identify high-risk areas and travel restrictions, check the CDC travel website: StageSync.si  If you live in, or must travel to, an area where COVID-19 is a risk, take precautions to  avoid infection. ? Stay away from people who are sick. ? Wash your hands often with soap and water for 20 seconds. If soap and water are not available, use an alcohol-based hand sanitizer. ? Avoid touching your mouth, face, eyes, or nose. ? Avoid going out in public, follow guidance from your state and local health authorities. ? If you must go out in public, wear a cloth face covering or face mask. Make sure your mask covers your nose and mouth. ? Avoid crowded indoor spaces.  Stay at least 6 feet (2 meters) away from others. ? Disinfect objects and surfaces that are frequently touched every day. This may include:  Counters and tables.  Doorknobs and light switches.  Sinks and faucets.  Electronics, such as phones, remote controls, keyboards, computers, and tablets. To protect others: If you have symptoms of COVID-19, take steps to prevent the virus from spreading to others.  If you think you have a COVID-19 infection, contact your health care provider right away. Tell your health care team that you think you may have a COVID-19 infection.  Stay home. Leave your house only to seek medical care. Do not use public transport.  Do not travel while you are sick.  Wash your hands often with soap and water for 20 seconds. If soap and water are not available, use alcohol-based hand sanitizer.  Stay away from other members of your household. Let healthy household members care for children and pets, if possible. If you have to care for children or pets, wash your hands often and wear a mask. If possible, stay in your own room, separate from others. Use a different bathroom.  Make sure that all people in your household wash their hands well and often.  Cough or sneeze into a tissue or your sleeve or elbow. Do not cough or sneeze into your hand or into the air.  Wear a cloth face covering or face mask. Make sure your mask covers your nose and mouth. Where to find more information  Centers for  Disease Control and Prevention: StickerEmporium.tn  World Health Organization: https://thompson-craig.com/ Contact a health care provider if:  You live in or have traveled to an area where COVID-19 is a risk and you have symptoms of the infection.  You have had contact with someone who has COVID-19 and you have symptoms of the infection. Get help right away if:  You have trouble breathing.  You have pain or pressure in your chest.  You have confusion.  You have bluish lips and fingernails.  You have difficulty waking from sleep.  You have symptoms that get worse. These symptoms may represent a serious problem that is an emergency. Do not wait to see if the symptoms will go away. Get medical help right away. Call your local emergency services (911 in the [place] Do not drive yourself to the hospital. Let the emergency medical personnel know if you think you have COVID-19. Summary  COVID-19 is a respiratory infection that is caused by a virus. It is also known as coronavirus disease or novel coronavirus. It can cause serious infections, such as pneumonia, acute respiratory distress syndrome, acute respiratory failure, or sepsis.  The virus that causes COVID-19 is contagious. This means that it can spread from person to person through droplets from breathing, speaking, singing, coughing, or sneezing.  You are more likely to develop a serious illness if you are 70 years of age or older, have a weak immune system, live in a nursing home, or have chronic disease.  There is no medicine to treat COVID-19. Your health care provider will talk with you about ways to treat your symptoms.  Take steps to protect yourself and others from infection. Wash your hands often and disinfect objects and surfaces that are frequently touched every day. Stay away from people who are sick and wear a mask if you are sick. This information is not intended to replace advice  given to you by your health care provider. Make sure you discuss any questions  you have with your health care provider. Document Revised: 03/30/2019 Document Reviewed: 07/06/2018 Elsevier Patient Education  2020 [employer].   COVID-19 Frequently Asked Questions COVID-19 (coronavirus disease) is an infection that is caused by a large family of viruses. Some viruses cause illness in people and others cause illness in animals like camels, cats, and bats. In some cases, the viruses that cause illness in animals can spread to humans. Where did the coronavirus come from? In December 2019, [place] told the [facility] of several cases of lung disease (human respiratory illness). These cases were linked to an open seafood and livestock market in the city of [place]. The link to the seafood and livestock market suggests that the virus may have spread from animals to humans. However, since that first outbreak in December, the virus has also been shown to spread from person to person. What is the name of the disease and the virus? Disease name Early on, this disease was called novel coronavirus. This is because scientists determined that the disease was caused by a new (novel) respiratory virus. The World Health Organization [facility]) has now named the disease COVID-19, or coronavirus disease. Virus name The virus that causes the disease is called severe acute respiratory syndrome coronavirus 2 (SARS-CoV-2). More information on disease and virus naming World Health Organization [facility]): www.who.int/emergencies/diseases/novel-coronavirus-2019/technical-guidance/naming-the-coronavirus-disease-(covid-2019)-and-the-virus-that-causes-it Who is at risk for complications from coronavirus disease? Some people may be at higher risk for complications from coronavirus disease. This includes older adults and people who have chronic diseases, such as heart disease, diabetes, and lung disease. If you are at  higher risk for complications, take these extra precautions:  Stay home as much as possible.  Avoid social gatherings and travel.  Avoid close contact with others. Stay at least 6 ft (2 m) away from others, if possible.  Wash your hands often with soap and water for at least 20 seconds.  Avoid touching your face, mouth, nose, or eyes.  Keep supplies on hand at home, such as food, medicine, and cleaning supplies.  If you must go out in public, wear a cloth face covering or face mask. Make sure your mask covers your nose and mouth. How does coronavirus disease spread? The virus that causes coronavirus disease spreads easily from person to person (is contagious). You may catch the virus by:  Breathing in droplets from an infected person. Droplets can be spread by a person breathing, speaking, singing, coughing, or sneezing.  Touching something, like a table or a doorknob, that was exposed to the virus (contaminated) and then touching your mouth, nose, or eyes. Can I get the virus from touching surfaces or objects? There is still a lot that we do not know about the virus that causes coronavirus disease. Scientists are basing a lot of information on what they know about similar viruses, such as:  Viruses cannot generally survive on surfaces for long. They need a human body (host) to survive.  It is more likely that the virus is spread by close contact with people who are sick (direct contact), such as through: ? Shaking hands or hugging. ? Breathing in respiratory droplets that travel through the air. Droplets can be spread by a person breathing, speaking, singing, coughing, or sneezing.  It is less likely that the virus is spread when a person touches a surface or object that has the virus on it (indirect contact). The virus may be able to enter the body if the person touches a surface or object and  then touches his or her face, eyes, nose, or mouth. Can a person spread the virus without  having symptoms of the disease? It may be possible for the virus to spread before a person has symptoms of the disease, but this is most likely not the main way the virus is spreading. It is more likely for the virus to spread by being in close contact with people who are sick and breathing in the respiratory droplets spread by a person breathing, speaking, singing, coughing, or sneezing. What are the symptoms of coronavirus disease? Symptoms vary from person to person and can range from mild to severe. Symptoms may include:  Fever or chills.  Cough.  Difficulty breathing or feeling short of breath.  Headaches, body aches, or muscle aches.  Runny or stuffy (congested) nose.  Sore throat.  New loss of taste or smell.  Nausea, vomiting, or diarrhea. These symptoms can appear anywhere from 2 to 14 days after you have been exposed to the virus. Some people may not have any symptoms. If you develop symptoms, call your health care provider. People with severe symptoms may need hospital care. Should I be tested for this virus? Your health care provider will decide whether to test you based on your symptoms, history of exposure, and your risk factors. How does a health care provider test for this virus? Health care providers will collect samples to send for testing. Samples may include:  Taking a swab of fluid from the back of your nose and throat, your nose, or your throat.  Taking fluid from the lungs by having you cough up mucus (sputum) into a sterile cup.  Taking a blood sample. Is there a treatment or vaccine for this virus? Currently, there is no vaccine to prevent coronavirus disease. Also, there are no medicines like antibiotics or antivirals to treat the virus. A person who becomes sick is given supportive care, which means rest and fluids. A person may also relieve his or her symptoms by using over-the-counter medicines that treat sneezing, coughing, and runny nose. These are the  same medicines that a person takes for the common cold. If you develop symptoms, call your health care provider. People with severe symptoms may need hospital care. What can I do to protect myself and my family from this virus?     You can protect yourself and your family by taking the same actions that you would take to prevent the spread of other viruses. Take the following actions:  Wash your hands often with soap and water for at least 20 seconds. If soap and water are not available, use alcohol-based hand sanitizer.  Avoid touching your face, mouth, nose, or eyes.  Cough or sneeze into a tissue, sleeve, or elbow. Do not cough or sneeze into your hand or the air. ? If you cough or sneeze into a tissue, throw it away immediately and wash your hands.  Disinfect objects and surfaces that you frequently touch every day.  Stay away from people who are sick.  Avoid going out in public, follow guidance from your state and local health authorities.  Avoid crowded indoor spaces. Stay at least 6 ft (2 m) away from others.  If you must go out in public, wear a cloth face covering or face mask. Make sure your mask covers your nose and mouth.  Stay home if you are sick, except to get medical care. Call your health care provider before you get medical care. Your health care provider will  tell you how long to stay home.  Make sure your vaccines are up to date. Ask your health care provider what vaccines you need. What should I do if I need to travel? Follow travel recommendations from your local health authority, the CDC, and WHO. Travel information and advice  Centers for Disease Control and Prevention (CDC): GeminiCard.gl  World Health Organization [facility]): PreviewDomains.se Know the risks and take action to protect your health  You are at higher risk of getting coronavirus disease if you are  traveling to areas with an outbreak or if you are exposed to travelers from areas with an outbreak.  Wash your hands often and practice good hygiene to lower the risk of catching or spreading the virus. What should I do if I am sick? General instructions to stop the spread of infection  Wash your hands often with soap and water for at least 20 seconds. If soap and water are not available, use alcohol-based hand sanitizer.  Cough or sneeze into a tissue, sleeve, or elbow. Do not cough or sneeze into your hand or the air.  If you cough or sneeze into a tissue, throw it away immediately and wash your hands.  Stay home unless you must get medical care. Call your health care provider or local health authority before you get medical care.  Avoid public areas. Do not take public transportation, if possible.  If you can, wear a mask if you must go out of the house or if you are in close contact with someone who is not sick. Make sure your mask covers your nose and mouth. Keep your home clean  Disinfect objects and surfaces that are frequently touched every day. This may include: ? Counters and tables. ? Doorknobs and light switches. ? Sinks and faucets. ? Electronics such as phones, remote controls, keyboards, computers, and tablets.  Wash dishes in hot, soapy water or use a dishwasher. Air-dry your dishes.  Wash laundry in hot water. Prevent infecting other household members  Let healthy household members care for children and pets, if possible. If you have to care for children or pets, wash your hands often and wear a mask.  Sleep in a different bedroom or bed, if possible.  Do not share personal items, such as razors, toothbrushes, deodorant, combs, brushes, towels, and washcloths. Where to find more information Centers for Disease Control and Prevention (CDC)  Information and news updates: CardRetirement.cz World Health Organization [facility])  Information and news  updates: AffordableSalon.es  Coronavirus health topic: https://thompson-craig.com/  Questions and answers on COVID-19: kruiseway.com  Global tracker: who.sprinklr.com American Academy of Pediatrics (AAP)  Information for families: www.healthychildren.org/English/health-issues/conditions/chest-lungs/Pages/2019-Novel-Coronavirus.aspx The coronavirus situation is changing rapidly. Check your local health authority website or the CDC and [facility] websites for updates and news. When should I contact a health care provider?  Contact your health care provider if you have symptoms of an infection, such as fever or cough, and you: ? Have been near anyone who is known to have coronavirus disease. ? Have come into contact with a person who is suspected to have coronavirus disease. ? Have traveled to an area where there is an outbreak of COVID-19. When should I get emergency medical care?  Get help right away by calling your local emergency services (911 in the [place] if you have: ? Trouble breathing. ? Pain or pressure in your chest. ? Confusion. ? Blue-tinged lips and fingernails. ? Difficulty waking from sleep. ? Symptoms that get worse. Let the emergency medical personnel  know if you think you have coronavirus disease. Summary  A new respiratory virus is spreading from person to person and causing COVID-19 (coronavirus disease).  The virus that causes COVID-19 appears to spread easily. It spreads from one person to another through droplets from breathing, speaking, singing, coughing, or sneezing.  Older adults and those with chronic diseases are at higher risk of disease. If you are at higher risk for complications, take extra precautions.  There is currently no vaccine to prevent coronavirus disease. There are no medicines, such as antibiotics or antivirals, to treat the virus.  You can protect yourself and  your family by washing your hands often, avoiding touching your face, and covering your coughs and sneezes. This information is not intended to replace advice given to you by your health care provider. Make sure you discuss any questions you have with your health care provider. Document Revised: 03/30/2019 Document Reviewed: 09/26/2018 Elsevier Patient Education  2020 [employer].

## 2020-04-27 LAB — CULTURE, BLOOD (ROUTINE X 2)
Culture: NO GROWTH
Culture: NO GROWTH
Special Requests: ADEQUATE
Special Requests: ADEQUATE

## 2020-07-30 IMAGING — DX DG CERVICAL SPINE COMPLETE 4+V
5 series · 5 of 5 positions shown · non-contrast
Comparison: October 05, 2013

CLINICAL DATA: Cervicalgia with left-sided radicular symptoms

EXAM:
CERVICAL SPINE - COMPLETE 4+ VIEW

[c-spine lat]
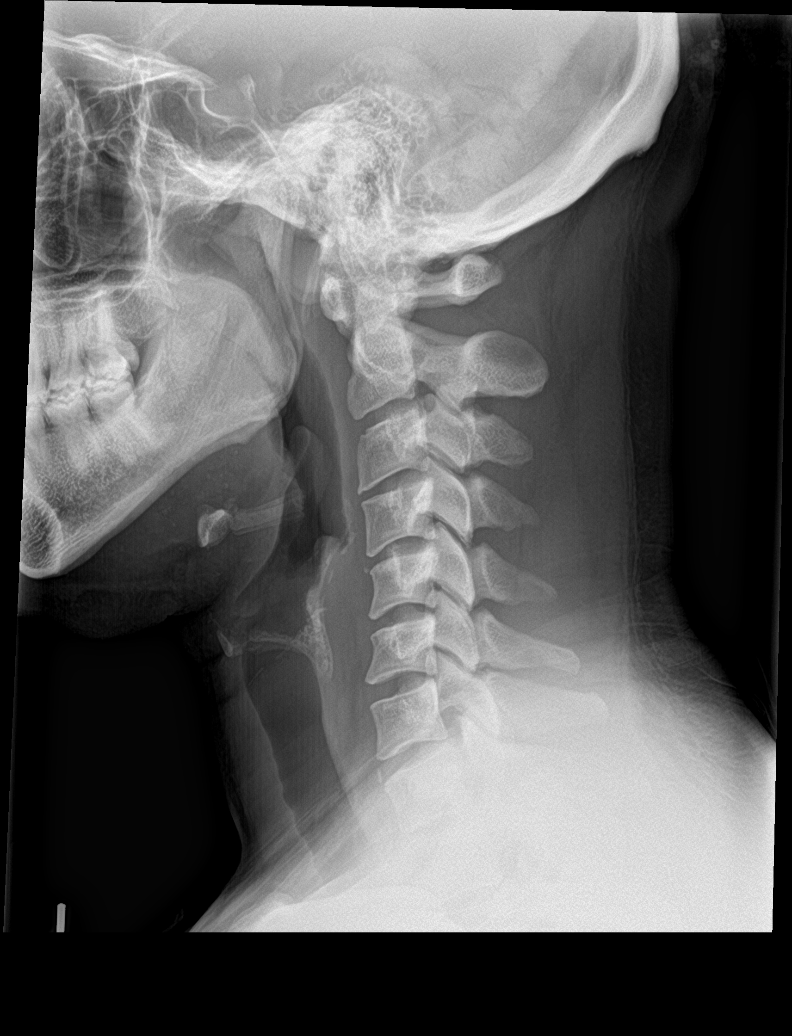

[c-spine obl (1 of 2)]
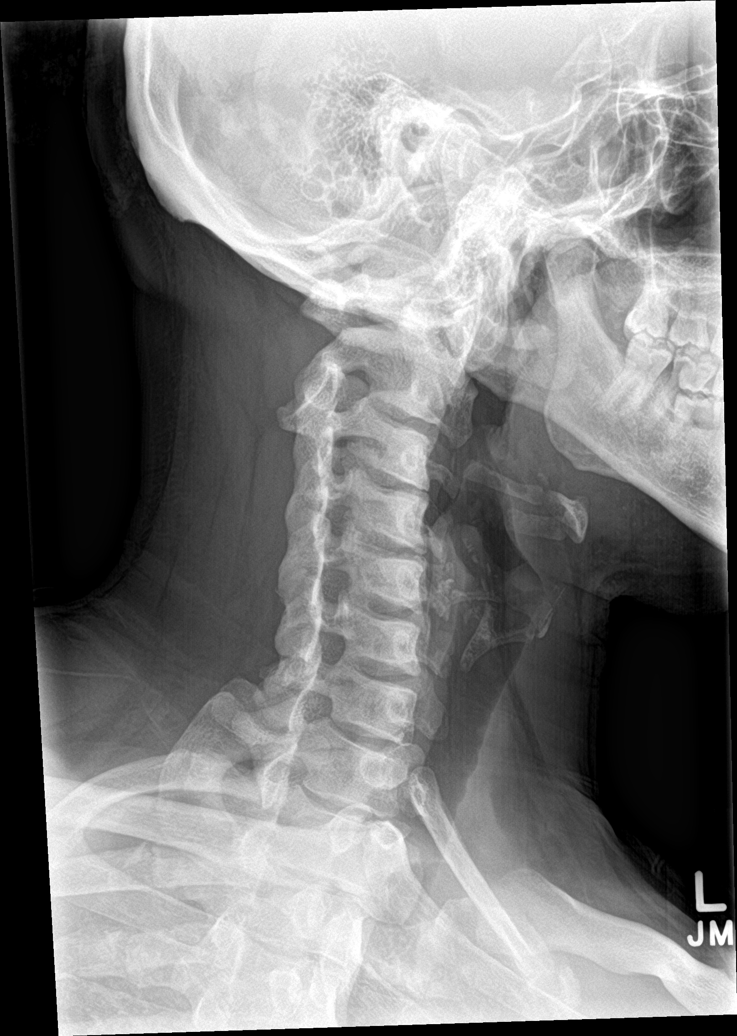

[c-spine obl (2 of 2)]
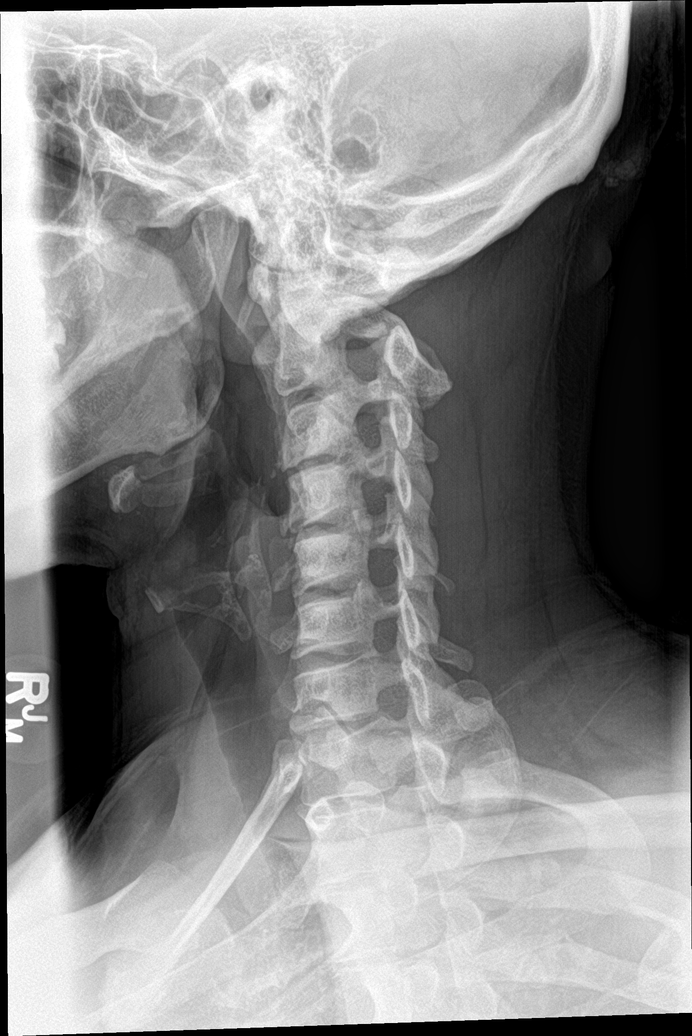

[c-spine ap]
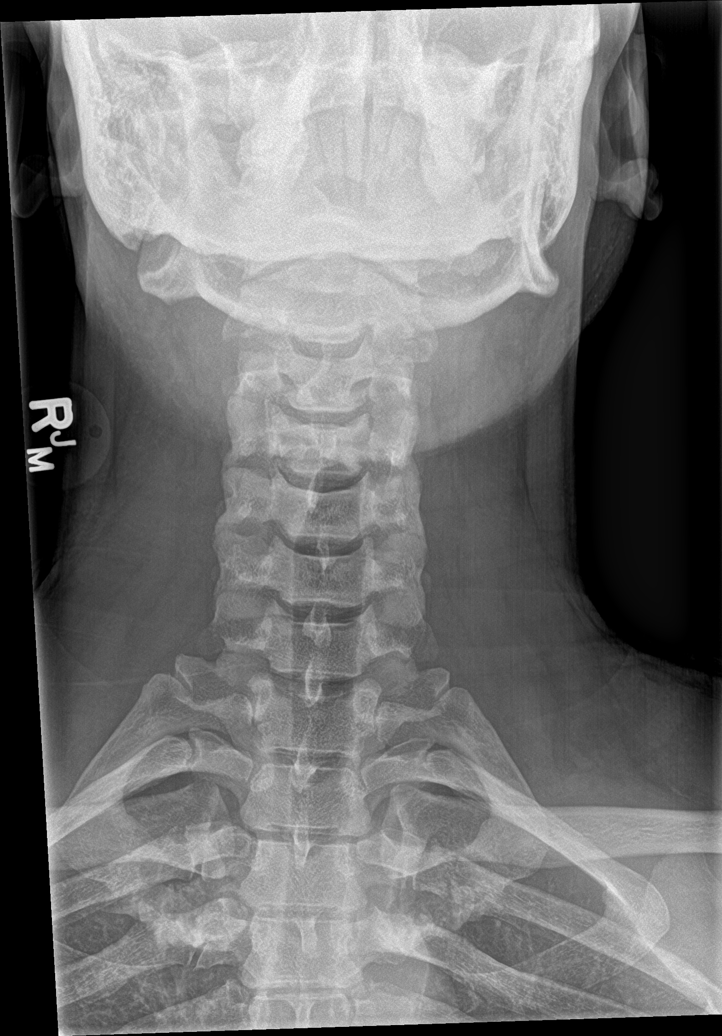

[c-spine open mouth]
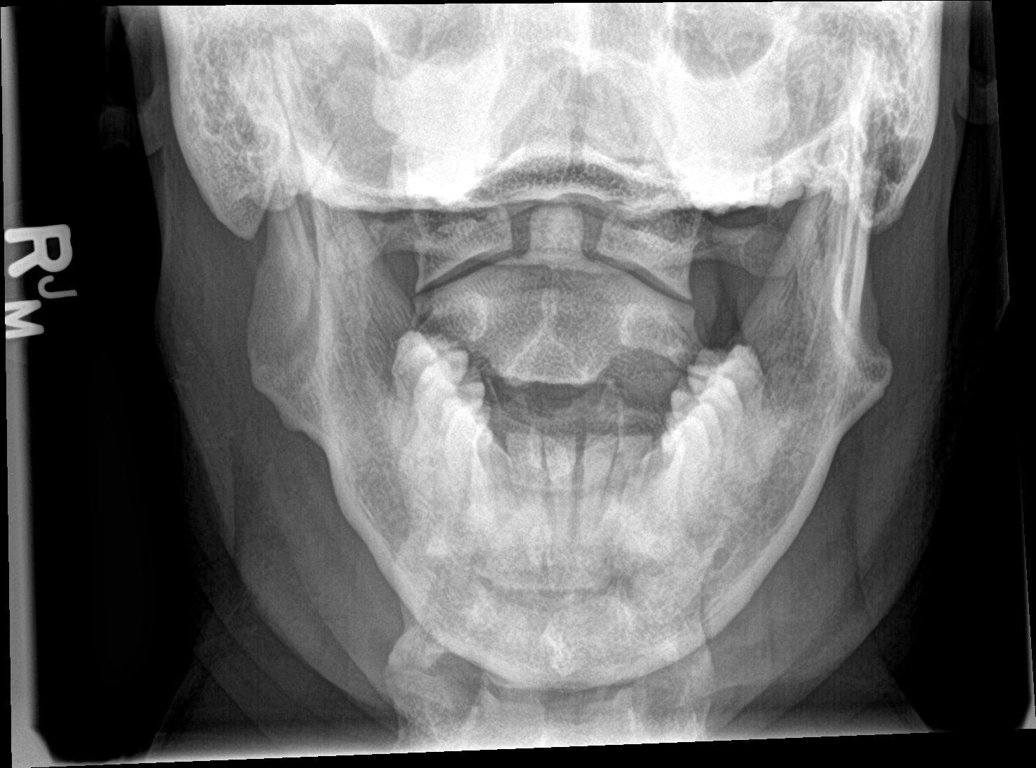

[5 of 5 positions shown; findings below may reference images not displayed]

FINDINGS: Frontal, lateral, open-mouth odontoid, and bilateral oblique views
were obtained. There is no fracture or spondylolisthesis.
Prevertebral soft tissues and predental space regions are normal.
There is mild disc space narrowing at C3-4 and C4-5. Other disc
spaces appear unremarkable. There is mild facet hypertrophy at C3-4
and C4-5 bilaterally. Lung apices are clear.

There is reversal of lordotic curvature.
IMPRESSION: Reversal of lordotic curvature, a finding most likely indicative of
a degree of muscle spasm. No fracture or spondylolisthesis. Areas of
relatively mild osteoarthritic change noted.

## 2022-03-24 ENCOUNTER — Ambulatory Visit: Payer: Self-pay | Admitting: Family Medicine

## 2022-03-26 ENCOUNTER — Ambulatory Visit: Payer: Self-pay | Admitting: Family Medicine

## 2022-03-26 ENCOUNTER — Ambulatory Visit (INDEPENDENT_AMBULATORY_CARE_PROVIDER_SITE_OTHER): Payer: PRIVATE HEALTH INSURANCE | Admitting: Family Medicine

## 2022-03-26 ENCOUNTER — Encounter: Payer: Self-pay | Admitting: Family Medicine

## 2022-03-26 VITALS — BP 120/80 | HR 78 | Temp 97.9°F | Ht 73.0 in | Wt 235.0 lb

## 2022-03-26 DIAGNOSIS — M2142 Flat foot [pes planus] (acquired), left foot: Secondary | ICD-10-CM

## 2022-03-26 DIAGNOSIS — M2141 Flat foot [pes planus] (acquired), right foot: Secondary | ICD-10-CM | POA: Diagnosis not present

## 2022-03-26 DIAGNOSIS — M79671 Pain in right foot: Secondary | ICD-10-CM

## 2022-03-26 DIAGNOSIS — M79672 Pain in left foot: Secondary | ICD-10-CM | POA: Diagnosis not present

## 2022-03-26 MED ORDER — MELOXICAM 15 MG PO TABS: 15.0000 mg | ORAL_TABLET | Freq: Every day | ORAL | 0 refills | Status: DC

## 2022-03-26 NOTE — Patient Instructions (Signed)
Consider Powerstep insoles. There are very quality over the counter inserts. Shop around online and in stores. Dr. Felicie Morn is a cheaper alternative, though is not as high of quality.   If you do not hear anything about your referral in the next 1-2 weeks, call our office and ask for an update.  Ice/cold pack over area for 10-15 min twice daily.  OK to take Tylenol 1000 mg (2 extra strength tabs) or 975 mg (3 regular strength tabs) every 6 hours as needed.  Let us know if you need anything.
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
## 2022-03-29 ENCOUNTER — Encounter: Payer: Self-pay | Admitting: Family Medicine

## 2022-03-29 ENCOUNTER — Ambulatory Visit (INDEPENDENT_AMBULATORY_CARE_PROVIDER_SITE_OTHER): Payer: PRIVATE HEALTH INSURANCE | Admitting: Family Medicine

## 2022-03-29 VITALS — BP 118/78 | Ht 73.0 in | Wt 235.0 lb

## 2022-03-29 DIAGNOSIS — M7741 Metatarsalgia, right foot: Secondary | ICD-10-CM | POA: Diagnosis not present

## 2022-03-29 DIAGNOSIS — G35 Multiple sclerosis: Secondary | ICD-10-CM | POA: Diagnosis not present

## 2022-03-29 DIAGNOSIS — G629 Polyneuropathy, unspecified: Secondary | ICD-10-CM | POA: Insufficient documentation

## 2022-03-29 DIAGNOSIS — M7742 Metatarsalgia, left foot: Secondary | ICD-10-CM

## 2022-03-29 MED ORDER — PREDNISONE 5 MG PO TABS: ORAL_TABLET | ORAL | 0 refills | Status: DC

## 2022-03-29 NOTE — Patient Instructions (Signed)
Nice to meet you Please try the insoles  I will call with the lab results.  I have placed a referral to neurology   Please send me a message in MyChart with any questions or updates.  Please see me back in 4 weeks.   --Dr. Raeford Razor

## 2022-03-29 NOTE — Assessment & Plan Note (Signed)
Acutely occurring.  Having pain bilaterally.  Transverse arch is fairly well-maintained is mild loss of the longitudinal arch. -Counseled on home exercise therapy and supportive care. -Green sport insoles with scaphoid pad. -Could consider custom orthotics.

## 2022-03-29 NOTE — Assessment & Plan Note (Signed)
Acute on chronic in nature.  His initial symptoms were altered sensation within his feet and hands.  His current symptoms may be playing a role.  Has not been followed by neurologist in some time. -Referral to neurology.

## 2022-03-29 NOTE — Assessment & Plan Note (Signed)
Acutely occurring.  His current symptoms fall more in line with a neuropathy origin as opposed to being associated with the structure of his feet. -Counseled on home exercise therapy and supportive care. -B12 and folate, iron and ferritin, TSH and CMP. -Prednisone. -Consider nerve study with him establishing care with neurology.
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
## 2022-03-30 ENCOUNTER — Encounter: Payer: Self-pay | Admitting: Neurology

## 2022-03-30 LAB — B12 AND FOLATE PANEL
Folate: 6.7 ng/mL (ref >3.0)
Vitamin B-12: 366 pg/mL (ref 232–1245)

## 2022-03-30 LAB — COMPREHENSIVE METABOLIC PANEL
ALT: 22 IU/L (ref 0–44)
AST: 21 IU/L (ref 0–40)
Albumin/Globulin Ratio: 2.2 (ref 1.2–2.2)
Albumin: 4.7 g/dL (ref 4.1–5.1)
Alkaline Phosphatase: 84 IU/L (ref 44–121)
BUN/Creatinine Ratio: 16 (ref 9–20)
BUN: 22 mg/dL — ABNORMAL HIGH (ref 6–20)
Bilirubin Total: 0.6 mg/dL (ref 0.0–1.2)
CO2: 22 mmol/L (ref 20–29)
Calcium: 9.4 mg/dL (ref 8.7–10.2)
Chloride: 105 mmol/L (ref 96–106)
Creatinine, Ser: 1.38 mg/dL — ABNORMAL HIGH (ref 0.76–1.27)
Globulin, Total: 2.1 g/dL (ref 1.5–4.5)
Glucose: 88 mg/dL (ref 70–99)
Potassium: 4.3 mmol/L (ref 3.5–5.2)
Sodium: 141 mmol/L (ref 134–144)
Total Protein: 6.8 g/dL (ref 6.0–8.5)
eGFR: 67 mL/min/{1.73_m2} (ref >59)

## 2022-03-30 LAB — IRON,TIBC AND FERRITIN PANEL
Ferritin: 154 ng/mL (ref 30–400)
Iron Saturation: 43 % (ref 15–55)
Iron: 121 ug/dL (ref 38–169)
Total Iron Binding Capacity: 283 ug/dL (ref 250–450)
UIBC: 162 ug/dL (ref 111–343)

## 2022-03-30 LAB — TSH: TSH: 0.793 u[IU]/mL (ref 0.450–4.500)

## 2022-03-31 ENCOUNTER — Telehealth: Payer: Self-pay | Admitting: Family Medicine

## 2022-03-31 NOTE — Telephone Encounter (Signed)
Left VM for patient. If he calls back please have him speak with a nurse/CMA and inform that his labs are normal. His kidney function is on the higher side of normal and can be followed up with his PCP. Will continue with neurology referral to consider a EMG/nerve study.   If any questions then please take the best time and phone number to call and I will try to call him back.   Rosemarie Ax, MD [facility] 03/31/2022, 2:44 PM

## 2022-04-08 ENCOUNTER — Encounter: Payer: Self-pay | Admitting: Neurology

## 2022-04-26 ENCOUNTER — Ambulatory Visit: Payer: PRIVATE HEALTH INSURANCE | Admitting: Family Medicine

## 2022-04-30 ENCOUNTER — Ambulatory Visit: Payer: PRIVATE HEALTH INSURANCE | Admitting: Family Medicine

## 2022-05-10 ENCOUNTER — Ambulatory Visit: Payer: PRIVATE HEALTH INSURANCE | Admitting: Family Medicine
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
## 2022-05-28 ENCOUNTER — Ambulatory Visit: Payer: PRIVATE HEALTH INSURANCE | Admitting: Neurology

## 2022-07-01 IMAGING — DX DG CHEST 1V PORT
1 series · 1 of 1 positions shown · non-contrast
Comparison: Radiograph 07/27/2011

CLINICAL DATA: Dyspnea, 1AMX3-AL positive 2 days prior

EXAM:
PORTABLE CHEST 1 VIEW

[chest ap]
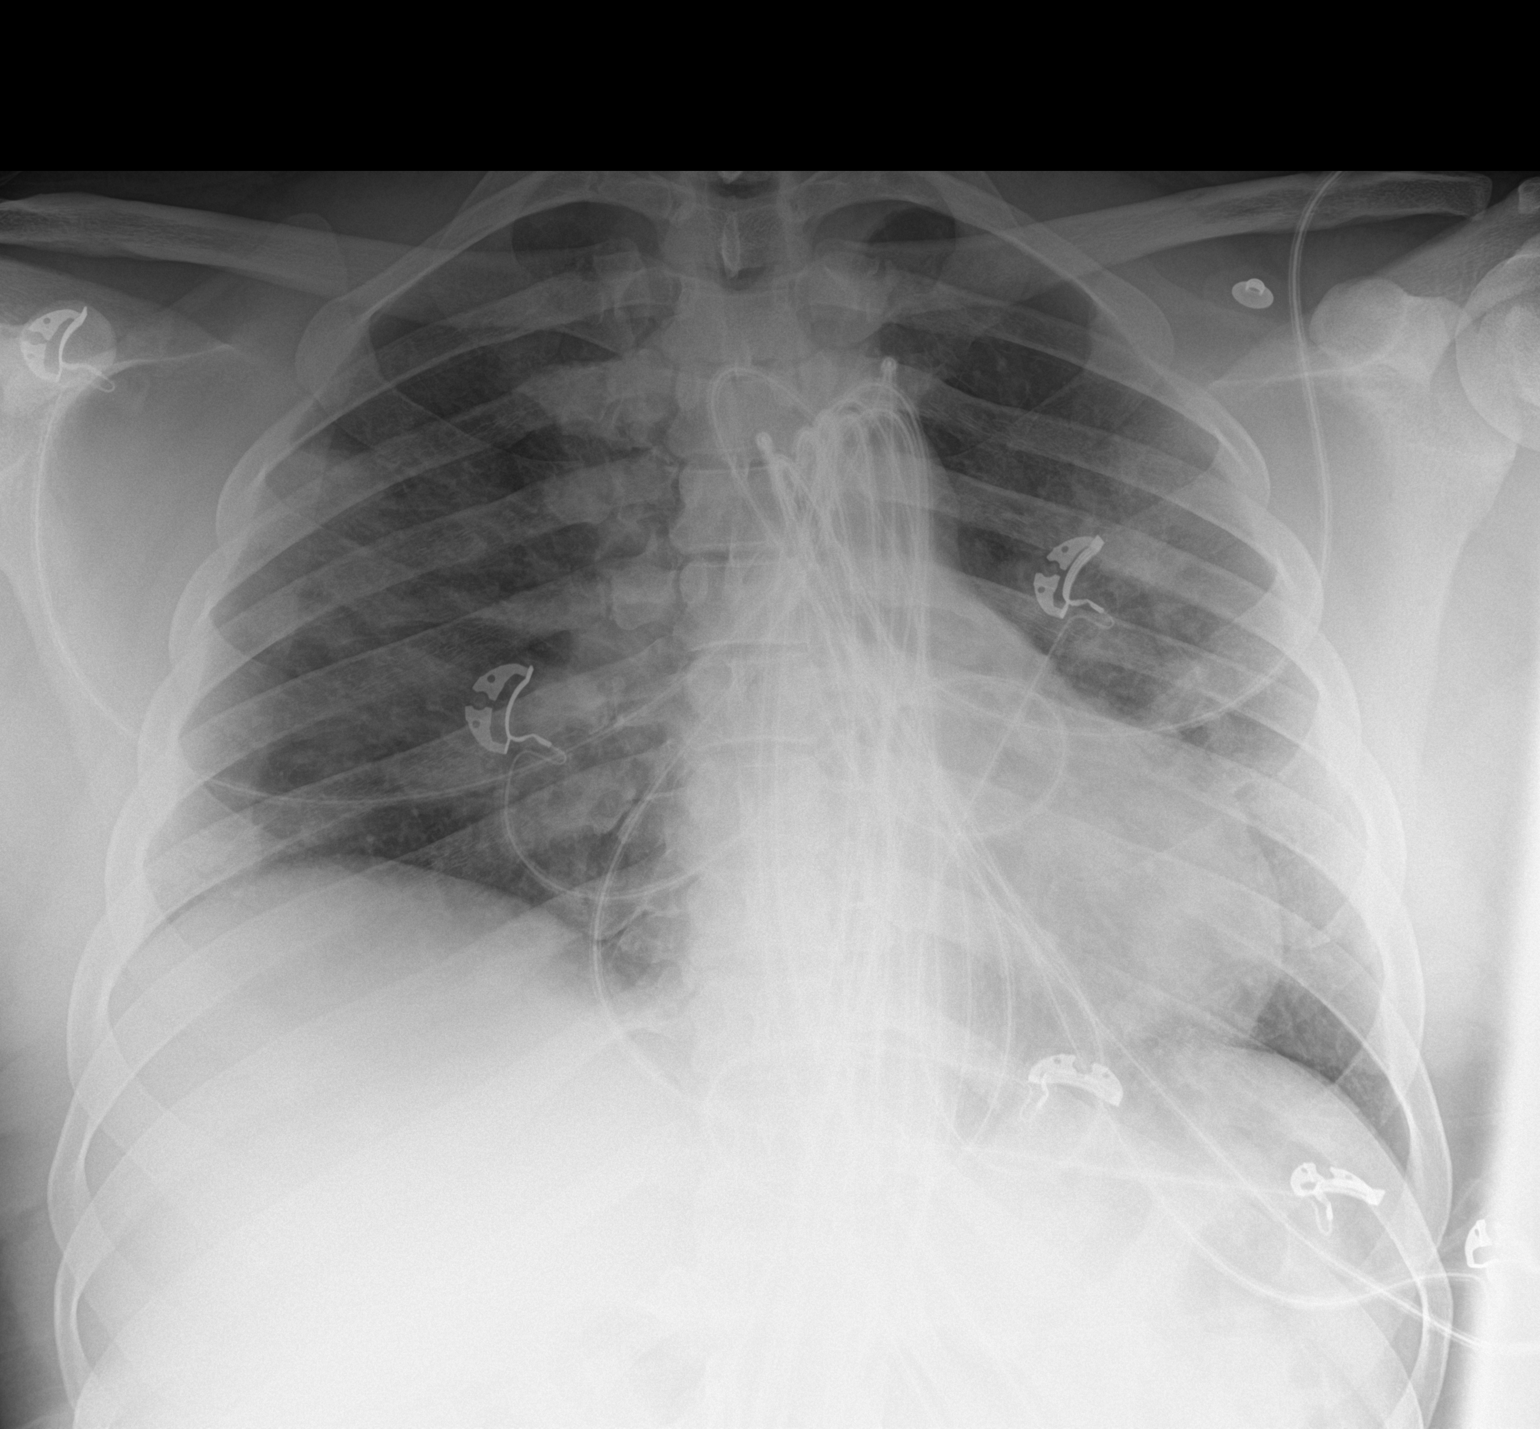

[1 of 1 positions shown; findings below may reference images not displayed]

FINDINGS: Patchy opacities are present throughout both lungs in a mid to lower
lung and peripheral predominance. No pneumothorax or effusion.
Cardiomediastinal contours are unremarkable for portable technique.
Telemetry leads overlie the chest. No acute osseous or soft tissue
abnormality.
IMPRESSION: Patchy opacities throughout both lungs, suspicious for atypical
infection in the setting of 1AMX3-AL.
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
## 2022-07-14 ENCOUNTER — Encounter: Payer: Self-pay | Admitting: Neurology

## 2022-07-14 ENCOUNTER — Ambulatory Visit: Payer: BC Managed Care – PPO | Admitting: Family Medicine

## 2022-07-14 ENCOUNTER — Encounter: Payer: Self-pay | Admitting: Family Medicine

## 2022-07-14 ENCOUNTER — Ambulatory Visit: Payer: BC Managed Care – PPO | Admitting: Neurology

## 2022-07-14 ENCOUNTER — Ambulatory Visit: Payer: Self-pay

## 2022-07-14 VITALS — BP 118/80 | Ht 73.0 in | Wt 238.0 lb

## 2022-07-14 VITALS — BP 139/85 | HR 77 | Ht 73.0 in | Wt 244.0 lb

## 2022-07-14 DIAGNOSIS — G35 Multiple sclerosis: Secondary | ICD-10-CM | POA: Diagnosis not present

## 2022-07-14 DIAGNOSIS — M79642 Pain in left hand: Secondary | ICD-10-CM

## 2022-07-14 DIAGNOSIS — M7742 Metatarsalgia, left foot: Secondary | ICD-10-CM

## 2022-07-14 DIAGNOSIS — M25476 Effusion, unspecified foot: Secondary | ICD-10-CM | POA: Insufficient documentation

## 2022-07-14 DIAGNOSIS — M79641 Pain in right hand: Secondary | ICD-10-CM

## 2022-07-14 DIAGNOSIS — M79671 Pain in right foot: Secondary | ICD-10-CM

## 2022-07-14 DIAGNOSIS — G629 Polyneuropathy, unspecified: Secondary | ICD-10-CM | POA: Diagnosis not present

## 2022-07-14 DIAGNOSIS — M7741 Metatarsalgia, right foot: Secondary | ICD-10-CM

## 2022-07-14 DIAGNOSIS — M79672 Pain in left foot: Secondary | ICD-10-CM

## 2022-07-14 MED ORDER — GABAPENTIN 300 MG PO CAPS: 300.0000 mg | ORAL_CAPSULE | Freq: Every day | ORAL | 5 refills | Status: DC
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
## 2022-07-14 NOTE — Patient Instructions (Signed)
good to see you  Please continue the insoles  We will call with the lab results.  Please send me a message in MyChart with any questions or updates.  Please see me back to make custom orthotics .   --Dr. Raeford Razor

## 2022-07-14 NOTE — Assessment & Plan Note (Signed)
Acutely occurring with nonspecific effusion related to the MTP joints.  Possibly to be inflammatory in nature. -Counseled on home exercise therapy and supportive care. -ANA, sed rate, CRP and uric acid. -Could consider further imaging.

## 2022-07-14 NOTE — Patient Instructions (Signed)
I would like to investigate your symptoms further with the following: -Blood work today -MRI brain and cervical spine w/wo contrast -Nerve test called an EMG (see more information below)  Your B12 was borderline low. Given your symptoms, I would recommend supplementing with B12 1000 mcg daily. This can be bought over the counter at any local drug store or online.  You can also try Lidocaine cream as needed. Apply wear you have pain, tingling, or burning. Wear gloves to prevent your hands being numb. This can be bought over the counter at any drug store or online.  I am prescribing gabapentin 300 mg at bedtime to see if this helps with your pain. This can cause sleepiness, so be careful when you first start taking it.  The physicians and staff at [facility] are committed to providing excellent care. You may receive a survey requesting feedback about your experience at our office. We strive to receive "very good" responses to the survey questions. If you feel that your experience would prevent you from giving the office a "very good " response, please contact our office to try to remedy the situation. We may be reached at [phone]. Thank you for taking the time out of your busy day to complete the survey.  Kai Levins, MD [facility]  ELECTROMYOGRAM AND NERVE CONDUCTION STUDIES (EMG/NCS) INSTRUCTIONS  How to Prepare The neurologist conducting the EMG will need to know if you have certain medical conditions. Tell the neurologist and other EMG lab personnel if you: Have a pacemaker or any other electrical medical device Take blood-thinning medications Have hemophilia, a blood-clotting disorder that causes prolonged bleeding Bathing Take a shower or bath shortly before your exam in order to remove oils from your skin. Don't apply lotions or creams before the exam.  What to Expect You'll likely be asked to change into a hospital gown for the procedure and lie down on an  examination table. The following explanations can help you understand what will happen during the exam.  Electrodes. The neurologist or a technician places surface electrodes at various locations on your skin depending on where you're experiencing symptoms. Or the neurologist may insert needle electrodes at different sites depending on your symptoms.  Sensations. The electrodes will at times transmit a tiny electrical current that you may feel as a twinge or spasm. The needle electrode may cause discomfort or pain that usually ends shortly after the needle is removed. If you are concerned about discomfort or pain, you may want to talk to the neurologist about taking a short break during the exam.  Instructions. During the needle EMG, the neurologist will assess whether there is any spontaneous electrical activity when the muscle is at rest - activity that isn't present in healthy muscle tissue - and the degree of activity when you slightly contract the muscle.  He or she will give you instructions on resting and contracting a muscle at appropriate times. Depending on what muscles and nerves the neurologist is examining, he or she may ask you to change positions during the exam.  After your EMG You may experience some temporary, minor bruising where the needle electrode was inserted into your muscle. This bruising should fade within several days. If it persists, contact your primary care doctor.

## 2022-07-14 NOTE — Assessment & Plan Note (Signed)
Acute on chronic in nature.  Continues to have altered sensation and pain in the feet. -Counseled on home exercise therapy and supportive care. -Green sport insole with scaphoid pad. -Can pursue custom orthotics.

## 2022-07-19 ENCOUNTER — Telehealth: Payer: Self-pay | Admitting: Family Medicine

## 2022-07-19 ENCOUNTER — Telehealth: Payer: Self-pay | Admitting: Neurology

## 2022-07-19 ENCOUNTER — Ambulatory Visit: Payer: BC Managed Care – PPO | Admitting: Neurology

## 2022-07-19 DIAGNOSIS — M79671 Pain in right foot: Secondary | ICD-10-CM

## 2022-07-19 DIAGNOSIS — G35 Multiple sclerosis: Secondary | ICD-10-CM

## 2022-07-19 DIAGNOSIS — M79672 Pain in left foot: Secondary | ICD-10-CM

## 2022-07-19 DIAGNOSIS — M79641 Pain in right hand: Secondary | ICD-10-CM

## 2022-07-19 LAB — ANA,IFA RA DIAG PNL W/RFLX TIT/PATN
ANA Titer 1: NEGATIVE
Cyclic Citrullin Peptide Ab: 8 units (ref 0–19)
Rheumatoid fact SerPl-aCnc: <10 IU/mL (ref <14.0)

## 2022-07-19 LAB — URIC ACID: Uric Acid: 4.2 mg/dL (ref 3.8–8.4)

## 2022-07-19 LAB — SEDIMENTATION RATE: Sed Rate: 3 mm/hr (ref 0–15)

## 2022-07-19 LAB — C-REACTIVE PROTEIN: CRP: 2 mg/L (ref 0–10)

## 2022-07-19 NOTE — Telephone Encounter (Signed)
Informed of results.   Rosemarie Ax, MD [facility] 07/19/2022, 9:45 AM

## 2022-07-19 NOTE — Telephone Encounter (Signed)
Discussed the results of patient's EMG after the procedure today. EMG was normal with no evidence of a large fiber polyneuropathy or cervical or lumbosacral radiculopathy to explain his pain in hands and feet. He is having his MRI brain and cervical spine in the next 2 weeks to further evaluation for central pathology (specifically MS).   All questions were answered.  Kai Levins, MD [facility]

## 2022-07-19 NOTE — Procedures (Signed)
[facility]  [place]  [place], [place] [address] Tel: [phone] Fax: [phone] Test Date:  07/19/2022  Patient: Shaun Bentley DOB: 25-Nov-1985 Physician: Kai Levins, MD  Sex: Male Height: 6\' 1"  Ref Phys: Kai Levins, MD  ID#: [id number]   Technician:    History: This is a 37 year old male with pain in feet and hands.  NCV & EMG Findings: Extensive electrodiagnostic evaluation of the right upper and lower limb shows: Right sural, superficial peroneal/fibular, median, ulnar, and radial sensory responses are within normal limits. Right peroneal/fibular (EDB), tibial (AH), median (APB), and ulnar (ADM) motor responses are within normal limits. Right H reflex latency is within normal limits. There is no evidence of active or chronic motor axon loss changes affecting any of the tested muscles on needle examination. Motor unit configuration and recruitment pattern is within normal limits.  Impression: This is a normal electrodiagnostic evaluation. Specifically: No electrodiagnostic evidence of a right cervical (C5-C8) or lumbosacral (L3-S1) motor radiculopathy. No electrodiagnostic evidence of a large fiber neuropathy or myopathy. No electrodiagnostic evidence of a right median mononeuropathy at or distal to the wrist, consistent with carpal tunnel syndrome.    ___________________________ Kai Levins, MD    Nerve Conduction Studies Motor Nerve Results    Latency Amplitude F-Lat Segment Distance CV Comment  Site (ms) Norm (mV) Norm (ms)  (cm) (m/s) Norm   Right Fibular (EDB) Motor  Ankle 3.2  < 5.5 6.9  > 3.0        Bel fib head 11.5 - 5.6 -  Bel fib head-Ankle 37 45  > 40   Pop fossa 13.5 - 5.1 -  Pop fossa-Bel fib head 9 45 -   Right Median (APB) Motor  Wrist 2.1  < 3.9 11.1  > 6.0        Elbow 7.3 - 10.7 -  Elbow-Wrist 29.5 57  > 50   Right Tibial (AH) Motor  Ankle 3.7  < 6.0 8.9  > 8.0        Knee 13.4 - 4.4 -  Knee-Ankle 47 48  > 40    Right Ulnar (ADM) Motor  Wrist 1.90  < 3.1 13.3  > 7.0        Bel elbow 6.6 - 11.7 -  Bel elbow-Wrist 26.5 56  > 50   Ab elbow 8.6 - 10.2 -  Ab elbow-Bel elbow 10 50 -    Sensory Sites    Neg Peak Lat Amplitude (O-P) Segment Distance Velocity Comment  Site (ms) Norm (V) Norm  (cm) (ms)   Right Median Sensory  Wrist-Dig II 2.8  < 3.4 20  > 20 Wrist-Dig II 13    Right Radial Sensory  Forearm-Wrist 1.95  < 2.7 22  > 18 Forearm-Wrist 10    Right Superficial Fibular Sensory  14 cm-Ankle 3.4  < 4.5 5  > 5 14 cm-Ankle 14    Right Sural Sensory  Calf-Lat mall 3.2  < 4.5 6  > 5 Calf-Lat mall 14    Right Ulnar Sensory  Wrist-Dig V 2.7  < 3.1 13  > 12 Wrist-Dig V 11     H-Reflex Results    M-Lat H Lat H Neg Amp H-M Lat  Site (ms) (ms) Norm (mV) (ms)  Right Tibial H-Reflex  Pop fossa 7.3 33.5  < 35.0 3.9 26.2   Electromyography   Side Muscle Ins.Act Fibs Fasc Recrt Amp Dur Poly Activation Comment  Right Tib ant  Nml Nml Nml Nml Nml Nml Nml Nml N/A  Right Gastroc MH Nml Nml Nml Nml Nml Nml Nml Nml N/A  Right FDL Nml Nml Nml Nml Nml Nml Nml Nml N/A  Right Rectus fem Nml Nml Nml Nml Nml Nml Nml Nml N/A  Right Gluteus med Nml Nml Nml Nml Nml Nml Nml Nml N/A  Right FDI Nml Nml Nml Nml Nml Nml Nml Nml N/A  Right EIP Nml Nml Nml Nml Nml Nml Nml Nml N/A  Right Pronator teres Nml Nml Nml Nml Nml Nml Nml Nml N/A  Right Biceps Nml Nml Nml Nml Nml Nml Nml Nml N/A  Right Triceps lat hd Nml Nml Nml Nml Nml Nml Nml Nml N/A  Right Deltoid Nml Nml Nml Nml Nml Nml Nml Nml N/A      Waveforms:  Motor           Sensory             H-Reflex

## 2022-08-01 ENCOUNTER — Ambulatory Visit
Admission: RE | Admit: 2022-08-01 | Discharge: 2022-08-01 | Disposition: A | Discharge status: Home / Self Care | Payer: BC Managed Care – PPO | Source: Ambulatory Visit | Attending: Neurology | Admitting: Neurology

## 2022-08-01 DIAGNOSIS — M47812 Spondylosis without myelopathy or radiculopathy, cervical region: Secondary | ICD-10-CM | POA: Diagnosis not present

## 2022-08-01 DIAGNOSIS — G379 Demyelinating disease of central nervous system, unspecified: Secondary | ICD-10-CM | POA: Diagnosis not present

## 2022-08-01 DIAGNOSIS — M79641 Pain in right hand: Secondary | ICD-10-CM

## 2022-08-01 DIAGNOSIS — M79671 Pain in right foot: Secondary | ICD-10-CM

## 2022-08-01 DIAGNOSIS — G629 Polyneuropathy, unspecified: Secondary | ICD-10-CM

## 2022-08-01 DIAGNOSIS — M5124 Other intervertebral disc displacement, thoracic region: Secondary | ICD-10-CM | POA: Diagnosis not present

## 2022-08-01 DIAGNOSIS — G35D Multiple sclerosis, unspecified: Secondary | ICD-10-CM

## 2022-08-01 DIAGNOSIS — G35 Multiple sclerosis: Secondary | ICD-10-CM

## 2022-08-01 DIAGNOSIS — M79672 Pain in left foot: Secondary | ICD-10-CM

## 2022-08-01 DIAGNOSIS — M4802 Spinal stenosis, cervical region: Secondary | ICD-10-CM | POA: Diagnosis not present

## 2022-08-01 MED ORDER — GADOPICLENOL 0.5 MMOL/ML IV SOLN
10.0000 mL | Freq: Once | INTRAVENOUS | Status: AC | PRN
  Administered 2022-08-01: 10 mL via INTRAVENOUS

## 2022-08-04 ENCOUNTER — Telehealth: Payer: Self-pay | Admitting: Neurology

## 2022-08-04 NOTE — Telephone Encounter (Signed)
Called patient to discuss the results of his MRI brain and cervical spine. There was NO evidence of demyelinating disease such as multiple sclerosis. Patient's history was also not consistent with multiple sclerosis.   The patient should no longer carry the diagnosis of multiple sclerosis.  Patient also had an EMG of the right upper and lower limbs that was normal. There is no current evidence that symptoms are neurologic in origin. I encouraged him to reach out to PCP to discuss other potential etiologies that could be explored for his symptoms.  All questions were answered.  Kai Levins, MD [facility]

## 2022-08-06 ENCOUNTER — Encounter: Payer: PRIVATE HEALTH INSURANCE | Admitting: Family Medicine

## 2022-08-16 ENCOUNTER — Encounter: Payer: PRIVATE HEALTH INSURANCE | Admitting: Family Medicine

## 2022-08-20 ENCOUNTER — Encounter: Payer: Self-pay | Admitting: Family Medicine

## 2022-08-20 ENCOUNTER — Ambulatory Visit: Payer: BC Managed Care – PPO | Admitting: Family Medicine

## 2022-08-20 VITALS — Ht 73.0 in | Wt 238.0 lb

## 2022-08-20 VITALS — BP 112/78 | HR 69 | Temp 98.6°F | Ht 73.0 in | Wt 241.0 lb

## 2022-08-20 DIAGNOSIS — M7741 Metatarsalgia, right foot: Secondary | ICD-10-CM

## 2022-08-20 DIAGNOSIS — A630 Anogenital (venereal) warts: Secondary | ICD-10-CM | POA: Diagnosis not present

## 2022-08-20 DIAGNOSIS — M7742 Metatarsalgia, left foot: Secondary | ICD-10-CM | POA: Diagnosis not present

## 2022-08-20 DIAGNOSIS — L918 Other hypertrophic disorders of the skin: Secondary | ICD-10-CM
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
## 2022-08-20 NOTE — Patient Instructions (Addendum)
Do not shower for the rest of the day. When you do wash it, use only soap and water. Do not vigorously scrub. Apply triple antibiotic ointment (like Neosporin) twice daily. Keep the area clean and dry.   Things to look out for: increasing pain not relieved by ibuprofen/acetaminophen, fevers, spreading redness, drainage of pus, or foul odor.  Let us know if you need anything. 

## 2022-08-20 NOTE — Assessment & Plan Note (Signed)
Acute on chronic in nature. Continues to have pain and EMG was normal.  - counseled on home exercise therapy and supportive care - orthotics today  - could consider additional padding
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
## 2022-08-27 ENCOUNTER — Other Ambulatory Visit: Payer: BC Managed Care – PPO

## 2022-09-17 ENCOUNTER — Ambulatory Visit: Payer: BC Managed Care – PPO | Admitting: Family Medicine

## 2022-09-24 ENCOUNTER — Ambulatory Visit: Payer: BC Managed Care – PPO | Admitting: Family Medicine

## 2022-09-27 ENCOUNTER — Encounter: Payer: Self-pay | Admitting: *Deleted

## 2023-04-18 ENCOUNTER — Ambulatory Visit: Payer: BC Managed Care – PPO | Admitting: Family Medicine

## 2023-04-18 ENCOUNTER — Encounter: Payer: Self-pay | Admitting: Family Medicine

## 2023-04-18 VITALS — BP 120/80 | HR 70 | Temp 98.0°F | Resp 16 | Ht 73.0 in | Wt 240.0 lb

## 2023-04-18 DIAGNOSIS — G629 Polyneuropathy, unspecified: Secondary | ICD-10-CM

## 2023-04-18 DIAGNOSIS — J029 Acute pharyngitis, unspecified: Secondary | ICD-10-CM | POA: Diagnosis not present

## 2023-04-18 LAB — POCT INFLUENZA A/B
Influenza A, POC: NEGATIVE
Influenza B, POC: NEGATIVE

## 2023-04-18 LAB — POCT RAPID STREP A (OFFICE): Rapid Strep A Screen: NEGATIVE

## 2023-04-18 MED ORDER — DULOXETINE HCL 20 MG PO CPEP: 20.0000 mg | ORAL_CAPSULE | Freq: Every day | ORAL | 3 refills | Status: AC
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
## 2023-04-18 NOTE — Addendum Note (Signed)
Addended by: Kathi Ludwig on: 04/18/2023 02:46 PM   Modules accepted: Orders

## 2023-04-18 NOTE — Patient Instructions (Signed)
Consider throat lozenges, salt water gargles and an air humidifier for symptomatic care.   OK to take Tylenol 1000 mg (2 extra strength tabs) or 975 mg (3 regular strength tabs) every 6 hours as needed.  Ibuprofen 400-600 mg (2-3 over the counter strength tabs) every 6 hours as needed for pain.  Let us know if you need anything.

## 2023-04-21 ENCOUNTER — Encounter: Payer: Self-pay | Admitting: Family Medicine

## 2023-04-21 ENCOUNTER — Ambulatory Visit: Payer: BC Managed Care – PPO | Admitting: Family Medicine

## 2023-04-21 ENCOUNTER — Telehealth: Payer: BC Managed Care – PPO | Admitting: Physician Assistant

## 2023-04-21 VITALS — BP 121/68 | HR 98 | Temp 98.7°F | Ht 73.0 in | Wt 239.0 lb

## 2023-04-21 DIAGNOSIS — J069 Acute upper respiratory infection, unspecified: Secondary | ICD-10-CM

## 2023-04-21 MED ORDER — FLUTICASONE PROPIONATE 50 MCG/ACT NA SUSP: 2.0000 | Freq: Every day | NASAL | 6 refills | Status: DC

## 2023-04-21 MED ORDER — PROMETHAZINE-DM 6.25-15 MG/5ML PO SYRP: 5.0000 mL | ORAL_SOLUTION | Freq: Four times a day (QID) | ORAL | 0 refills | Status: AC | PRN

## 2023-04-21 NOTE — Patient Instructions (Signed)
Likely Viral Upper Respiratory Infection  Continue supportive measures including rest, hydration, humidifier use, steam showers, warm compresses to sinuses, warm liquids with lemon and honey, and over-the-counter cough, cold, and analgesics as needed. If symptoms persist 8-10 days, become severe, or return after a few days of feeling better, then please follow-up for repeat evaluation to determine if antibiotics may be necessary.  Over the counter medications that may be helpful for symptoms:  Guaifenesin 1200 mg extended release tabs twice daily, with plenty of water For cough and congestion Brand name: Mucinex   Pseudoephedrine 30 mg, one or two tabs every 4 to 6 hours For sinus congestion Brand name: Sudafed You must get this from the pharmacy counter.  Oxymetazoline nasal spray each morning, one spray in each nostril, for NO MORE THAN 3 days  For nasal and sinus congestion Brand name: Afrin Saline nasal spray or Saline Nasal Irrigation (Netti Pot, etc) 3-5 times a day For nasal and sinus congestion Brand names: Ocean or AYR Fluticasone nasal spray OR Mometasone nasal spray OR Triamcinolone Acetonide nasal spray - follow directions on the packaging For nasal and sinus congestion Brand name: Flonase, Nasonex, Nasacort Warm salt water gargles  For sore throat Every few hours as needed Alternate ibuprofen 400-600 mg and acetaminophen 1000 mg every 6 hours For fever, body aches, headache Brand names: Motrin or Advil and Tylenol Dextromethorphan 12-hour cough version 30 mg every 12 hours  For cough Brand name: Delsym Stop all other cold medications for now (Nyquil, Dayquil, Tylenol Cold, Theraflu, etc) and other non-prescription cough/cold preparations. Many of these have the same ingredients listed above and could cause an overdose of medication.   Herbal treatments that have been shown to be helpful in some patients include: Vitamin C 1000 mg per day Zinc 100 mg per day Quercetin  25-500 mg twice a day Melatonin 5-10mg  at bedtime Honey Green Tea  General Instructions Allow your body to rest Drink PLENTY of fluids Typically, we are the most contagious 1-2 days before symptoms start through the first 2-3 days of most severe symptoms. Per CDC guidelines, you can return to school/work when symptoms have started to improve and you have been fever-free for 24 hours. However, recommend you continue extra precautions for the following 5 days (frequent hand hygiene, masking, covering coughs/sneezes, minimize exposure to immunocompromised individuals, etc).  If you develop severe shortness of breath, uncontrolled fevers, coughing up blood, confusion, chest pain, or signs of dehydration (such as significantly decreased urine amounts or dizziness with standing) please go to the nearest ER.

## 2023-04-21 NOTE — Progress Notes (Signed)
Acute Office Visit  Subjective:     Patient ID: Shaun Bentley, male    DOB: [birth year], 37 y.o.   MRN: [id number]  Chief Complaint  Patient presents with   Sore Throat     Patient is in today for sore throat, cough.   Discussed the use of AI scribe software for clinical note transcription with the patient, who gave verbal consent to proceed.  History of Present Illness   The patient, with a history of allergies, presented with 6-7 days of illness characterized by a sore throat, cough, and fatigue. The symptoms began approximately 10 days ago and have progressively worsened, particularly at night. The patient reported feeling extremely unwell upon waking, with body aches and a sense of general malaise.  The cough was described as dry and non-productive, causing significant throat irritation and discomfort. The patient attempted to manage the cough with over-the-counter numbing throat spray, which provided temporary relief. The patient also reported severe fatigue and body aches, particularly noticeable at night, to the point of not wanting to be touched.  In addition to the cough and body aches, the patient experienced nasal congestion, which was severe enough to impede breathing through the nose. The patient also reported sensitivity to bright light, which was managed by keeping the house dark.  The patient denied experiencing ear pain, nausea, vomiting, diarrhea, or chest pain. However, they reported a sensation of tightness in the chest when coughing. The patient had been managing symptoms with over-the-counter medications.  Patient saw PCP on Monday and was negative for Strep and Flu. The patient denied recent exposure to sick individuals. 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            ROS All review of systems negative except what is listed in the HPI       Objective:    BP 121/68   Pulse 98   Temp 98.7 F (37.1 C) (Oral)   Ht 6\' 1"  (1.854 m)   Wt 239 lb (108.4 kg)   SpO2 100%   BMI 31.53 kg/m    Physical Exam Vitals reviewed.  Constitutional:      Appearance: He is well-developed.  HENT:     Right Ear: Tympanic membrane normal.     Left Ear: Tympanic membrane normal.     Nose: Congestion and rhinorrhea present.     Mouth/Throat:     Mouth: Mucous membranes are moist.     Pharynx: No posterior oropharyngeal erythema.     Tonsils: No tonsillar exudate or tonsillar abscesses.  Eyes:     Conjunctiva/sclera: Conjunctivae normal.  Cardiovascular:     Rate and Rhythm: Normal rate and regular rhythm.  Pulmonary:     Effort: Pulmonary effort is normal. No respiratory distress.     Breath sounds: Normal breath sounds. No wheezing, rhonchi or rales.  Skin:    General: Skin is warm and dry.  Neurological:     Mental Status: He is alert and oriented to person, place, and time.     No results found for any visits on 04/21/23.      Assessment & Plan:   Problem List Items Addressed This Visit   None Visit Diagnoses     Viral URI with cough    -  Primary   Relevant Medications   fluticasone (FLONASE) 50 MCG/ACT nasal spray   promethazine-dextromethorphan (PROMETHAZINE-DM) 6.25-15 MG/5ML  syrup      Persistent symptoms of cough, sore throat, fatigue, and body aches for approximately one week. Negative for flu and strep. Minimal improvement with supportive measures. No alarming symptoms such as high fever, hemoptysis, or severe dyspnea. -Prescribe cough syrup (non-codeine due to allergy) to help with cough and sleep disturbance. -Prescribe Flonase to help with nasal congestion. -Continue over-the-counter Mucinex and throat spray. -Advise supportive measures including rest, hydration, and humidifier use. -If no improvement by 04/25/2023, schedule follow-up or video visit. If worse before then, let us know. Consider ABX if not  improving by day 8-10. -Provide work note for 04/21/2023 and 04/22/2023, with return to work on Monday        Meds ordered this encounter  Medications   fluticasone (FLONASE) 50 MCG/ACT nasal spray    Sig: Place 2 sprays into both nostrils daily.    Dispense:  16 g    Refill:  6    Order Specific Question:   Supervising Provider    Answer:   Danise Edge A [[id number]]   promethazine-dextromethorphan (PROMETHAZINE-DM) 6.25-15 MG/5ML syrup    Sig: Take 5 mLs by mouth 4 (four) times daily as needed.    Dispense:  118 mL    Refill:  0    Order Specific Question:   Supervising Provider    Answer:   Danise Edge A [[id number]]    Return if symptoms worsen or fail to improve.  Clayborne Dana, NP

## 2023-04-21 NOTE — Progress Notes (Signed)
Because of note of constant pain in chest/abdomen with these symptoms, I feel your condition warrants further evaluation and I recommend that you be seen in a face to face visit.   NOTE: There will be NO CHARGE for this eVisit   If you are having a true medical emergency please call 911.      For an urgent face to face visit, [place] has eight urgent care centers for your convenience:   NEW!! [facility] Center at [facility] Get Driving Directions [phone] [address] [place], [address]    [facility] Health Urgent Care Center at [facility] Get Driving Directions [phone] [address] [place], [place] [address]   [facility] Health Urgent Care Center [facility]) Get Driving Directions [phone] [address] [place], [place] [address]  [facility] Health Urgent Care Center [facility] [place]) Get Driving Directions [phone] [address] [place],  [place]  [address]  [facility] Ur[facility] Care Center [facility] - at [employer]  [phone] [address] [place],  [place] [address]   [facility] at [facility] Get Driving Directions [phone] [address] [place], [place] [address]   [facility] Urgent Care at [facility] Get Driving Directions  [phone] [address] [place], [place] [address]   [facility] Health Urgent Care at [facility] Directions [phone] [address], [address] [place], [place] [address]  Your MyChart E-visit questionnaire answers were reviewed by a board certified advanced clinical practitioner to complete your personal care plan based on your specific symptoms.  Thank you for using e-Visits.

## 2023-05-18 ENCOUNTER — Ambulatory Visit: Payer: BC Managed Care – PPO | Admitting: Family Medicine

## 2023-11-21 ENCOUNTER — Telehealth: Payer: Self-pay | Admitting: Family Medicine

## 2023-11-21 ENCOUNTER — Ambulatory Visit: Payer: Self-pay

## 2023-11-21 NOTE — Telephone Encounter (Unsigned)
 Copied from CRM [id number]. Topic: Clinical - Red Word Triage >> Nov 21, 2023  8:03 AM Lysbeth Sauger wrote: Red Word that prompted transfer to Nurse Triage: PT seen blood in toilet

## 2023-11-21 NOTE — Telephone Encounter (Addendum)
 FYI Only or Action Required?: FYI only for provider  Patient was last seen in primary care on 04/21/2023 by Everlina Hock, NP. Called Nurse Triage reporting Rectal Bleeding. Symptoms began several weeks ago. Interventions attempted: Nothing. Symptoms are: unchanged.  Triage Disposition: See Physician Within 24 Hours  Patient/caregiver understands and will follow disposition?:                     Copied from CRM [id number]. Topic: Clinical - Red Word Triage >> Nov 21, 2023  8:03 AM Lysbeth Sauger wrote: Red Word that prompted transfer to Nurse Triage: PT seen blood in toilet      Reason for Disposition  MODERATE rectal bleeding (small blood clots, passing blood without stool, or toilet water turns red)  Answer Assessment - Initial Assessment Questions 1. APPEARANCE of BLOOD: "What color is it?" "Is it passed separately, on the surface of the stool, or mixed in with the stool?"      Not mixed in with stool, bright red 2. AMOUNT: "How much blood was passed?"      "He's reporting it's a lot" 3. FREQUENCY: "How many times has blood been passed with the stools?"      2-3 episodes 4. ONSET: "When was the blood first seen in the stools?" (Days or weeks)      X 2 weeks, intermittent 5. DIARRHEA: "Is there also some diarrhea?" If Yes, ask: "How many diarrhea stools in the past 24 hours?"      None 6. CONSTIPATION: "Do you have constipation?" If Yes, ask: "How bad is it?"     None  8. BLOOD THINNERS: "Do you take any blood thinners?" (e.g., Coumadin/warfarin, Pradaxa/dabigatran, aspirin)     None 9. OTHER SYMPTOMS: "Do you have any other symptoms?"  (e.g., abdomen pain, vomiting, dizziness, fever)     None  Protocols used: Rectal Bleeding-A-AH

## 2023-11-22 ENCOUNTER — Other Ambulatory Visit (HOSPITAL_COMMUNITY)
Admission: RE | Admit: 2023-11-22 | Discharge: 2023-11-22 | Disposition: A | Discharge status: Home / Self Care | Source: Ambulatory Visit | Attending: Family Medicine | Admitting: Family Medicine

## 2023-11-22 ENCOUNTER — Encounter: Payer: Self-pay | Admitting: Family Medicine

## 2023-11-22 ENCOUNTER — Ambulatory Visit: Admitting: Family Medicine

## 2023-11-22 VITALS — BP 132/82 | HR 71 | Temp 98.0°F | Resp 16 | Ht 73.0 in | Wt 235.0 lb

## 2023-11-22 DIAGNOSIS — K9049 Malabsorption due to intolerance, not elsewhere classified: Secondary | ICD-10-CM

## 2023-11-22 DIAGNOSIS — K625 Hemorrhage of anus and rectum: Secondary | ICD-10-CM

## 2023-11-22 DIAGNOSIS — Z113 Encounter for screening for infections with a predominantly sexual mode of transmission: Secondary | ICD-10-CM

## 2023-11-22 DIAGNOSIS — H9201 Otalgia, right ear: Secondary | ICD-10-CM

## 2023-11-22 MED ORDER — FLUTICASONE PROPIONATE 50 MCG/ACT NA SUSP: 2.0000 | Freq: Every day | NASAL | 6 refills | Status: AC

## 2023-11-22 NOTE — Patient Instructions (Signed)
 If you do not hear anything about your referral in the next 1-2 weeks, call our office and ask for an update.  Give us  2-3 business days to get the results of your labs back.   Claritin (loratadine), Allegra (fexofenadine), Zyrtec (cetirizine) which is also equivalent to Xyzal (levocetirizine); these are listed in order from weakest to strongest. Generic, and therefore cheaper, options are in the parentheses.   Flonase  (fluticasone ); nasal spray that is over the counter. 2 sprays each nostril, once daily. Aim towards the same side eye when you spray.  There are available OTC, and the generic versions, which may be cheaper, are in parentheses. Show this to a pharmacist if you have trouble finding any of these items.  OK to use Debrox (peroxide) in the ear to loosen up wax. Also recommend using a bulb syringe (for removing boogers from baby's noses) to flush through warm water and vinegar (3-4:1 ratio). An alternative, though more expensive, is an elephant ear washer wax removal kit. Do not use Q-tips as this can impact wax further.  Let us  know if you need anything.
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
## 2023-11-23 ENCOUNTER — Ambulatory Visit: Payer: Self-pay | Admitting: Family Medicine

## 2023-11-23 LAB — FOOD ALLERGY PROFILE

## 2023-11-23 LAB — INTERPRETATION:

## 2023-11-24 LAB — URINE CYTOLOGY ANCILLARY ONLY
Chlamydia: NEGATIVE
Comment: NEGATIVE
Comment: NEGATIVE
Comment: NORMAL
Neisseria Gonorrhea: NEGATIVE
Trichomonas: NEGATIVE

## 2023-12-28 ENCOUNTER — Encounter: Payer: Self-pay | Admitting: Gastroenterology

## 2024-02-02 DIAGNOSIS — M9904 Segmental and somatic dysfunction of sacral region: Secondary | ICD-10-CM | POA: Diagnosis not present

## 2024-02-02 DIAGNOSIS — M7918 Myalgia, other site: Secondary | ICD-10-CM | POA: Diagnosis not present

## 2024-02-02 DIAGNOSIS — M9903 Segmental and somatic dysfunction of lumbar region: Secondary | ICD-10-CM | POA: Diagnosis not present

## 2024-02-02 DIAGNOSIS — M9905 Segmental and somatic dysfunction of pelvic region: Secondary | ICD-10-CM | POA: Diagnosis not present

## 2024-02-20 ENCOUNTER — Ambulatory Visit: Admitting: Gastroenterology

## 2024-03-16 ENCOUNTER — Ambulatory Visit: Admitting: Gastroenterology

## 2024-05-04 ENCOUNTER — Ambulatory Visit: Admitting: Gastroenterology
